# Patient Record
Sex: Female | Born: 1995 | Race: White | Hispanic: No | Marital: Single | Smoking: Never smoker
Health system: Southern US, Community
[De-identification: ages and names within clinical notes are randomized; demographics above are authoritative.]

## PROBLEM LIST (undated history)

## (undated) ENCOUNTER — Inpatient Hospital Stay (HOSPITAL_COMMUNITY): Payer: Self-pay

## (undated) DIAGNOSIS — R1116 Cannabis hyperemesis syndrome: Secondary | ICD-10-CM

## (undated) DIAGNOSIS — R519 Headache, unspecified: Secondary | ICD-10-CM

## (undated) DIAGNOSIS — J45909 Unspecified asthma, uncomplicated: Secondary | ICD-10-CM

## (undated) HISTORY — DX: Cannabis hyperemesis syndrome: R11.16

## (undated) HISTORY — DX: Headache, unspecified: R51.9

## (undated) HISTORY — PX: WISDOM TOOTH EXTRACTION: SHX21

---
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
## 2016-04-05 ENCOUNTER — Encounter (HOSPITAL_COMMUNITY): Payer: Self-pay | Admitting: *Deleted

## 2016-04-05 ENCOUNTER — Emergency Department (HOSPITAL_COMMUNITY)
Admission: EM | Admit: 2016-04-05 | Discharge: 2016-04-06 | Disposition: A | Discharge status: Home / Self Care | Payer: Medicaid Other | Attending: Emergency Medicine | Admitting: Emergency Medicine

## 2016-04-05 ENCOUNTER — Emergency Department (HOSPITAL_COMMUNITY): Payer: Medicaid Other

## 2016-04-05 DIAGNOSIS — J45901 Unspecified asthma with (acute) exacerbation: Secondary | ICD-10-CM | POA: Insufficient documentation

## 2016-04-05 DIAGNOSIS — R059 Cough, unspecified: Secondary | ICD-10-CM

## 2016-04-05 DIAGNOSIS — Z793 Long term (current) use of hormonal contraceptives: Secondary | ICD-10-CM | POA: Diagnosis not present

## 2016-04-05 DIAGNOSIS — Z79899 Other long term (current) drug therapy: Secondary | ICD-10-CM | POA: Diagnosis not present

## 2016-04-05 DIAGNOSIS — F17200 Nicotine dependence, unspecified, uncomplicated: Secondary | ICD-10-CM | POA: Diagnosis not present

## 2016-04-05 DIAGNOSIS — R05 Cough: Secondary | ICD-10-CM

## 2016-04-05 DIAGNOSIS — J029 Acute pharyngitis, unspecified: Secondary | ICD-10-CM | POA: Diagnosis not present

## 2016-04-05 DIAGNOSIS — R062 Wheezing: Secondary | ICD-10-CM

## 2016-04-05 HISTORY — DX: Unspecified asthma, uncomplicated: J45.909

## 2016-04-05 MED ORDER — ALBUTEROL SULFATE (2.5 MG/3ML) 0.083% IN NEBU
5.0000 mg | INHALATION_SOLUTION | Freq: Once | RESPIRATORY_TRACT | Status: AC
  Administered 2016-04-05: 5 mg via RESPIRATORY_TRACT
  Filled 2016-04-05: qty 6

## 2016-04-05 MED ORDER — BENZONATATE 100 MG PO CAPS: 100.0000 mg | ORAL_CAPSULE | Freq: Three times a day (TID) | ORAL | Status: DC

## 2016-04-05 MED ORDER — IPRATROPIUM BROMIDE 0.02 % IN SOLN
0.5000 mg | Freq: Once | RESPIRATORY_TRACT | Status: AC
  Administered 2016-04-05: 0.5 mg via RESPIRATORY_TRACT
  Filled 2016-04-05: qty 2.5
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
## 2016-04-05 NOTE — Discharge Instructions (Signed)
Take the prescribed medication as directed to help with cough.  Use albuterol inhaler for rescue. Follow-up with your primary care physician. Return to the ED for new or worsening symptoms.
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
## 2016-04-05 NOTE — Progress Notes (Signed)
Patient listed as having [place] without a pcp.  Pcp listed on patient's Medicaid card is Dr. Feliberto Hartsavid Martin Rubin.  Patient confirms this is her pcp.  System updated.

## 2016-04-06 NOTE — ED Notes (Signed)
Patient was alert, oriented and stable upon discharge. RN went over AVS and patient had no further questions.  

## 2016-07-03 ENCOUNTER — Inpatient Hospital Stay (HOSPITAL_COMMUNITY): Payer: Medicaid Other

## 2016-07-03 ENCOUNTER — Encounter (HOSPITAL_COMMUNITY): Payer: Self-pay

## 2016-07-03 ENCOUNTER — Inpatient Hospital Stay (HOSPITAL_COMMUNITY)
Admission: AD | Admit: 2016-07-03 | Discharge: 2016-07-04 | Disposition: A | Discharge status: Home / Self Care | Payer: Medicaid Other | Source: Ambulatory Visit | Attending: Pediatrics | Admitting: Pediatrics

## 2016-07-03 DIAGNOSIS — Z3A01 Less than 8 weeks gestation of pregnancy: Secondary | ICD-10-CM | POA: Diagnosis not present

## 2016-07-03 DIAGNOSIS — O99331 Smoking (tobacco) complicating pregnancy, first trimester: Secondary | ICD-10-CM | POA: Diagnosis not present

## 2016-07-03 DIAGNOSIS — B9689 Other specified bacterial agents as the cause of diseases classified elsewhere: Secondary | ICD-10-CM | POA: Diagnosis not present

## 2016-07-03 DIAGNOSIS — O23591 Infection of other part of genital tract in pregnancy, first trimester: Secondary | ICD-10-CM | POA: Insufficient documentation

## 2016-07-03 DIAGNOSIS — Z3491 Encounter for supervision of normal pregnancy, unspecified, first trimester: Secondary | ICD-10-CM

## 2016-07-03 DIAGNOSIS — F17200 Nicotine dependence, unspecified, uncomplicated: Secondary | ICD-10-CM | POA: Diagnosis not present

## 2016-07-03 DIAGNOSIS — N760 Acute vaginitis: Secondary | ICD-10-CM | POA: Insufficient documentation

## 2016-07-03 DIAGNOSIS — R102 Pelvic and perineal pain: Secondary | ICD-10-CM | POA: Diagnosis present

## 2016-07-03 DIAGNOSIS — R109 Unspecified abdominal pain: Secondary | ICD-10-CM

## 2016-07-03 DIAGNOSIS — O26899 Other specified pregnancy related conditions, unspecified trimester: Secondary | ICD-10-CM

## 2016-07-03 DIAGNOSIS — A499 Bacterial infection, unspecified: Secondary | ICD-10-CM | POA: Diagnosis not present

## 2016-07-03 LAB — WET PREP, GENITAL
Sperm: NONE SEEN
Trich, Wet Prep: NONE SEEN
Yeast Wet Prep HPF POC: NONE SEEN

## 2016-07-03 LAB — CBC
HCT: 35.9 % — ABNORMAL LOW (ref 36.0–46.0)
Hemoglobin: 12.1 g/dL (ref 12.0–15.0)
MCH: 30.3 pg (ref 26.0–34.0)
MCHC: 33.7 g/dL (ref 30.0–36.0)
MCV: 89.8 fL (ref 78.0–100.0)
Platelets: 217 10*3/uL (ref 150–400)
RBC: 4 MIL/uL (ref 3.87–5.11)
RDW: 13.5 % (ref 11.5–15.5)
WBC: 9.3 10*3/uL (ref 4.0–10.5)

## 2016-07-03 LAB — URINE MICROSCOPIC-ADD ON

## 2016-07-03 LAB — URINALYSIS, ROUTINE W REFLEX MICROSCOPIC
Bilirubin Urine: NEGATIVE
Glucose, UA: NEGATIVE mg/dL
Hgb urine dipstick: NEGATIVE
Ketones, ur: NEGATIVE mg/dL
Nitrite: POSITIVE — AB
Protein, ur: NEGATIVE mg/dL
Specific Gravity, Urine: 1.01 (ref 1.005–1.030)
pH: 6.5 (ref 5.0–8.0)

## 2016-07-03 LAB — POCT PREGNANCY, URINE: Preg Test, Ur: POSITIVE — AB

## 2016-07-03 LAB — HCG, QUANTITATIVE, PREGNANCY: hCG, Beta Chain, Quant, S: 12211 m[IU]/mL — ABNORMAL HIGH (ref <5)

## 2016-07-03 NOTE — MAU Provider Note (Signed)
History   [id number]   Chief Complaint  Patient presents with  . Possible Pregnancy    HPI Vicki Daniel is a 20 y.o. female  G0P0000 at Unknown pregnancy here with report of lower uterine cramping intermittent x 2 weeks.  Pain is midpelvis rated a 8/10.  Denies vaginal bleeding.  No report of nausea or vomiting.  Pt does not remember LMP.  No report of abnormal vaginal discharge.     No LMP recorded (lmp unknown).  OB History  Gravida Para Term Preterm AB Living  0 0 0 0 0 0  SAB TAB Ectopic Multiple Live Births  0 0 0 0 0        Past Medical History:  Diagnosis Date  . Asthma     History reviewed. No pertinent family history.  Social History   Social History  . Marital status: Single    Spouse name: N/A  . Number of children: N/A  . Years of education: N/A   Social History Main Topics  . Smoking status: Current Some Day Smoker  . Smokeless tobacco: Never Used  . Alcohol use No  . Drug use: No  . Sexual activity: Yes    Birth control/ protection: None   Other Topics Concern  . None   Social History Narrative  . None    No Known Allergies  No current facility-administered medications on file prior to encounter.    Current Outpatient Prescriptions on File Prior to Encounter  Medication Sig Dispense Refill  . benzonatate (TESSALON) 100 MG capsule Take 1 capsule (100 mg total) by mouth every 8 (eight) hours. 21 capsule 0  . cetirizine (ZYRTEC) 10 MG tablet Take 10 mg by mouth daily as needed for allergies.   12  . dextromethorphan-guaiFENesin (MUCINEX DM) 30-600 MG 12hr tablet Take 1 tablet by mouth 2 (two) times daily as needed for cough.    [facility] 1/35 1-35 MG-MCG tablet Take 1 tablet by mouth daily.  12  . Pseudoephedrine-APAP-DM (DAYQUIL PO) Take 30 mLs by mouth every 6 (six) hours as needed (cold symptoms).       Review of Systems   Physical Exam   Vitals:   07/03/16 2151  BP: 116/68  Pulse: 70  Resp: 16  Temp: 98.2 F (36.8 C)   TempSrc: Oral  SpO2: 100%  Weight: 175 lb (79.4 kg)  Height: 5\' 9"  (1.753 m)    Physical Exam  MAU Course  Procedures  Results for orders placed or performed during the hospital encounter of 07/03/16 (from the past 24 hour(s))  Urinalysis, Routine w reflex microscopic (not at [facility])     Status: Abnormal   Collection Time: 07/03/16  9:58 PM  Result Value Ref Range   Color, Urine YELLOW YELLOW   APPearance CLEAR CLEAR   Specific Gravity, Urine 1.010 1.005 - 1.030   pH 6.5 5.0 - 8.0   Glucose, UA NEGATIVE NEGATIVE mg/dL   Hgb urine dipstick NEGATIVE NEGATIVE   Bilirubin Urine NEGATIVE NEGATIVE   Ketones, ur NEGATIVE NEGATIVE mg/dL   Protein, ur NEGATIVE NEGATIVE mg/dL   Nitrite POSITIVE (A) NEGATIVE   Leukocytes, UA TRACE (A) NEGATIVE  Urine microscopic-add on     Status: Abnormal   Collection Time: 07/03/16  9:58 PM  Result Value Ref Range   Squamous Epithelial / LPF 0-5 (A) NONE SEEN   WBC, UA 0-5 0 - 5 WBC/hpf   RBC / HPF 0-5 0 - 5 RBC/hpf   Bacteria, UA MANY (A)  NONE SEEN  Pregnancy, urine POC     Status: Abnormal   Collection Time: 07/03/16 10:34 PM  Result Value Ref Range   Preg Test, Ur POSITIVE (A) NEGATIVE  CBC     Status: Abnormal   Collection Time: 07/03/16 10:45 PM  Result Value Ref Range   WBC 9.3 4.0 - 10.5 K/uL   RBC 4.00 3.87 - 5.11 MIL/uL   Hemoglobin 12.1 12.0 - 15.0 g/dL   HCT 78.2 (L) 95.6 - 21.3 %   MCV 89.8 78.0 - 100.0 fL   MCH 30.3 26.0 - 34.0 pg   MCHC 33.7 30.0 - 36.0 g/dL   RDW 08.6 57.8 - 46.9 %   Platelets 217 150 - 400 K/uL  hCG, quantitative, pregnancy     Status: Abnormal   Collection Time: 07/03/16 10:45 PM  Result Value Ref Range   hCG, Beta Chain, Quant, S 12,211 (H) <5 mIU/mL  Wet prep, genital     Status: Abnormal   Collection Time: 07/03/16 11:27 PM  Result Value Ref Range   Yeast Wet Prep HPF POC NONE SEEN NONE SEEN   Trich, Wet Prep NONE SEEN NONE SEEN   Clue Cells Wet Prep HPF POC PRESENT (A) NONE SEEN   WBC, Wet Prep  HPF POC FEW (A) NONE SEEN   Sperm NONE SEEN     Ultrasound: FINDINGS: The uterus is anteverted. There is a single intrauterine gestational sac containing a yolk sac and fetal pole. The fetus is too small to detect cardiac activity. CRL:  2  mm   5 w   5 d                  [place] EDC: 02/10/2017 Subchorionic hemorrhage:  None visualized. Maternal uterus/adnexae: The maternal ovaries appear unremarkable. There is a 1.4 x 1.0 x 1.2 cm corpus luteum in the left ovary. No no significant free fluid within pelvis. IMPRESSION: Single intrauterine pregnancy with an estimated gestational age of [redacted] weeks, 5 days. No definite fetal cardiac activity identified at this time. Follow-up recommended. Assessment and Plan  20 y.o. G0P0000 at 5.5 wks IUP Bacterial Vaginosis  Plan: Discharge home RX Flagyl 500 mg BID x 7 days Begin prenatal vitamins Schedule prenatal care visit  Vicki Daniel Kennith Gain, CNM 07/03/2016 12:08 AM

## 2016-07-03 NOTE — MAU Note (Signed)
Pt had +upt at home. Does not remember LMP. Is not having cramping now-had some cramping every dayx 3 weeks. Denies vag bleeding or discharge.

## 2016-07-04 ENCOUNTER — Encounter (HOSPITAL_COMMUNITY): Payer: Self-pay | Admitting: *Deleted

## 2016-07-04 DIAGNOSIS — A499 Bacterial infection, unspecified: Secondary | ICD-10-CM | POA: Diagnosis not present

## 2016-07-04 DIAGNOSIS — N760 Acute vaginitis: Secondary | ICD-10-CM

## 2016-07-04 LAB — HIV ANTIBODY (ROUTINE TESTING W REFLEX): HIV Screen 4th Generation wRfx: NONREACTIVE

## 2016-07-04 MED ORDER — PRENATAL VITAMINS 0.8 MG PO TABS: 1.0000 | ORAL_TABLET | Freq: Every day | ORAL | 12 refills | Status: DC

## 2016-07-04 MED ORDER — METRONIDAZOLE 500 MG PO TABS: 500.0000 mg | ORAL_TABLET | Freq: Two times a day (BID) | ORAL | 0 refills | Status: AC

## 2016-07-04 NOTE — Discharge Instructions (Signed)

## 2016-07-05 LAB — GC/CHLAMYDIA PROBE AMP (~~LOC~~) NOT AT ARMC
Chlamydia: NEGATIVE
Neisseria Gonorrhea: NEGATIVE

## 2016-07-20 ENCOUNTER — Inpatient Hospital Stay (HOSPITAL_COMMUNITY)
Admission: AD | Admit: 2016-07-20 | Discharge: 2016-07-20 | Disposition: A | Discharge status: Home / Self Care | Payer: Medicaid Other | Source: Ambulatory Visit | Attending: Obstetrics and Gynecology | Admitting: Obstetrics and Gynecology

## 2016-07-20 DIAGNOSIS — O26891 Other specified pregnancy related conditions, first trimester: Secondary | ICD-10-CM | POA: Diagnosis not present

## 2016-07-20 DIAGNOSIS — N760 Acute vaginitis: Principal | ICD-10-CM

## 2016-07-20 DIAGNOSIS — R12 Heartburn: Secondary | ICD-10-CM | POA: Diagnosis not present

## 2016-07-20 DIAGNOSIS — B9689 Other specified bacterial agents as the cause of diseases classified elsewhere: Secondary | ICD-10-CM

## 2016-07-20 MED ORDER — METRONIDAZOLE 500 MG PO TABS
500.0000 mg | ORAL_TABLET | Freq: Two times a day (BID) | ORAL | 0 refills | Status: DC
Start: 2016-07-20 — End: 2017-01-02

## 2016-07-20 MED ORDER — PRENATAL VITAMINS 0.8 MG PO TABS: 1.0000 | ORAL_TABLET | Freq: Every day | ORAL | 12 refills | Status: DC

## 2016-07-20 MED ORDER — FAMOTIDINE 20 MG PO TABS
20.0000 mg | ORAL_TABLET | Freq: Two times a day (BID) | ORAL | 0 refills | Status: DC
Start: 2016-07-20 — End: 2016-07-23

## 2016-07-20 NOTE — MAU Provider Note (Signed)
Ms. Vicki Daniel is a 20 y.o. G1P0000 at 3131w1d who presents to MAU today requesting her medication be re-sent to her pharmacy. She had Rx for Flagyl and Prenatal vitamins sent to her pharmacy on 07/04/16. She states that it was never received. She is also complaining of heartburn. She has not taken anything for this over-the-counter yet. She denies abdominal pain or vaginal bleeding. She had IUP on US previously and [facility].   Patient reassured medications would be resent and that we would add Rx for Pepcid for heartburn. Patient voiced understanding.   A: SIUP at 5431w1d Bacterial vaginosis Heartburn  P: Discharge home Rx for Prenatal vitamins, Flagyl and Pepcid resent to patients pharmacy First trimester precautions discussed Patient advised [facility] to start prenatal care Patient may return to MAU as needed or if her condition were to change or worsen  Marny LowensteinJulie N Ayumi Wangerin, PA-C 07/20/2016 5:55 PM

## 2016-07-20 NOTE — MAU Note (Signed)
Pt presents to MAU stating that she was evaluated last month and her medications were not called in. Vonzella NippleJulie Wenzel PA in with pt discussing plan of care with pt.

## 2016-07-20 NOTE — Discharge Instructions (Signed)

## 2016-07-23 ENCOUNTER — Inpatient Hospital Stay (HOSPITAL_COMMUNITY)
Admission: AD | Admit: 2016-07-23 | Discharge: 2016-07-23 | Disposition: A | Discharge status: Home / Self Care | Payer: Medicaid Other | Source: Ambulatory Visit | Attending: Obstetrics & Gynecology | Admitting: Obstetrics & Gynecology

## 2016-07-23 ENCOUNTER — Encounter (HOSPITAL_COMMUNITY): Payer: Self-pay | Admitting: *Deleted

## 2016-07-23 ENCOUNTER — Inpatient Hospital Stay (HOSPITAL_COMMUNITY): Payer: Medicaid Other

## 2016-07-23 DIAGNOSIS — O4691 Antepartum hemorrhage, unspecified, first trimester: Secondary | ICD-10-CM

## 2016-07-23 DIAGNOSIS — O99511 Diseases of the respiratory system complicating pregnancy, first trimester: Secondary | ICD-10-CM | POA: Insufficient documentation

## 2016-07-23 DIAGNOSIS — O26891 Other specified pregnancy related conditions, first trimester: Secondary | ICD-10-CM | POA: Diagnosis not present

## 2016-07-23 DIAGNOSIS — R109 Unspecified abdominal pain: Secondary | ICD-10-CM | POA: Insufficient documentation

## 2016-07-23 DIAGNOSIS — O2341 Unspecified infection of urinary tract in pregnancy, first trimester: Secondary | ICD-10-CM | POA: Diagnosis not present

## 2016-07-23 DIAGNOSIS — J45909 Unspecified asthma, uncomplicated: Secondary | ICD-10-CM | POA: Diagnosis not present

## 2016-07-23 DIAGNOSIS — O360110 Maternal care for anti-D [Rh] antibodies, first trimester, not applicable or unspecified: Secondary | ICD-10-CM

## 2016-07-23 DIAGNOSIS — O209 Hemorrhage in early pregnancy, unspecified: Secondary | ICD-10-CM | POA: Insufficient documentation

## 2016-07-23 DIAGNOSIS — Z3A08 8 weeks gestation of pregnancy: Secondary | ICD-10-CM | POA: Insufficient documentation

## 2016-07-23 DIAGNOSIS — Z3491 Encounter for supervision of normal pregnancy, unspecified, first trimester: Secondary | ICD-10-CM

## 2016-07-23 DIAGNOSIS — R110 Nausea: Secondary | ICD-10-CM | POA: Diagnosis not present

## 2016-07-23 DIAGNOSIS — Z87891 Personal history of nicotine dependence: Secondary | ICD-10-CM | POA: Diagnosis not present

## 2016-07-23 DIAGNOSIS — Z79899 Other long term (current) drug therapy: Secondary | ICD-10-CM | POA: Diagnosis not present

## 2016-07-23 DIAGNOSIS — O26899 Other specified pregnancy related conditions, unspecified trimester: Secondary | ICD-10-CM

## 2016-07-23 LAB — URINALYSIS, ROUTINE W REFLEX MICROSCOPIC
Bilirubin Urine: NEGATIVE
Glucose, UA: 100 mg/dL — AB
Hgb urine dipstick: NEGATIVE
Ketones, ur: NEGATIVE mg/dL
Leukocytes, UA: NEGATIVE
Nitrite: POSITIVE — AB
Protein, ur: NEGATIVE mg/dL
Specific Gravity, Urine: >1.03 — ABNORMAL HIGH (ref 1.005–1.030)
pH: 5.5 (ref 5.0–8.0)

## 2016-07-23 LAB — CBC
HCT: 36 % (ref 36.0–46.0)
Hemoglobin: 12.7 g/dL (ref 12.0–15.0)
MCH: 31.1 pg (ref 26.0–34.0)
MCHC: 35.3 g/dL (ref 30.0–36.0)
MCV: 88 fL (ref 78.0–100.0)
Platelets: 228 10*3/uL (ref 150–400)
RBC: 4.09 MIL/uL (ref 3.87–5.11)
RDW: 13.2 % (ref 11.5–15.5)
WBC: 10 10*3/uL (ref 4.0–10.5)

## 2016-07-23 LAB — URINE MICROSCOPIC-ADD ON

## 2016-07-23 LAB — HCG, QUANTITATIVE, PREGNANCY: hCG, Beta Chain, Quant, S: 82069 m[IU]/mL — ABNORMAL HIGH (ref <5)

## 2016-07-23 LAB — ABO/RH: ABO/RH(D): A NEG

## 2016-07-23 MED ORDER — RHO D IMMUNE GLOBULIN 1500 UNIT/2ML IJ SOSY
300.0000 ug | PREFILLED_SYRINGE | Freq: Once | INTRAMUSCULAR | Status: AC
  Administered 2016-07-23: 300 ug via INTRAMUSCULAR
  Filled 2016-07-23: qty 2

## 2016-07-23 MED ORDER — PROMETHAZINE HCL 12.5 MG PO TABS: 12.5000 mg | ORAL_TABLET | Freq: Four times a day (QID) | ORAL | 0 refills | Status: DC | PRN

## 2016-07-23 MED ORDER — CEPHALEXIN 500 MG PO CAPS: 500.0000 mg | ORAL_CAPSULE | Freq: Four times a day (QID) | ORAL | 0 refills | Status: DC

## 2016-07-23 MED ORDER — PROMETHAZINE HCL 25 MG PO TABS
25.0000 mg | ORAL_TABLET | Freq: Once | ORAL | Status: AC
  Administered 2016-07-23: 25 mg via ORAL
  Filled 2016-07-23: qty 1

## 2016-07-23 NOTE — MAU Provider Note (Signed)
History     CSN: [id number]  Arrival date and time: 07/23/16 1409   First Provider Initiated Contact with Patient 07/23/16 1501      Chief Complaint  Patient presents with  . Vaginal Bleeding  . Abdominal Cramping   HPI Vicki Daniel is a 20 y.o. G1P0000 at [redacted]w[redacted]d who presents with abdominal pain & vaginal bleeding. Reports lower abdominal cramping that has been present throughout pregnancy. Since yesterday, has noticed intermittent sharp pain in suprapubic area that is new. Currently rates pain 0/10.  Reports vaginal bleeding that started yesterday. Has noticed blood on toilet paper. Spotting was pink and is now brown. Denies recent intercourse.   Denies dysuria, diarrhea, constipation, urinary frequency, or vaginal discharge. Some nausea, no vomiting.  Plans on going to [facility] for prenatal care but hasn't scheduled appt yet.  OB History    Gravida Para Term Preterm AB Living   1 0 0 0 0 0   SAB TAB Ectopic Multiple Live Births   0 0 0 0 0      Past Medical History:  Diagnosis Date  . Asthma     Past Surgical History:  Procedure Laterality Date  . WISDOM TOOTH EXTRACTION      No family history on file.  Social History  Substance Use Topics  . Smoking status: Former Smoker    Quit date: 04/22/2016  . Smokeless tobacco: Never Used  . Alcohol use No    Allergies: No Known Allergies  Prescriptions Prior to Admission  Medication Sig Dispense Refill Last Dose  . cetirizine (ZYRTEC) 10 MG tablet Take 10 mg by mouth daily as needed for allergies.   12 04/04/2016 at Unknown time  . famotidine (PEPCID) 20 MG tablet Take 1 tablet (20 mg total) by mouth 2 (two) times daily. 30 tablet 0   . metroNIDAZOLE (FLAGYL) 500 MG tablet Take 1 tablet (500 mg total) by mouth 2 (two) times daily. 14 tablet 0   . Prenatal Multivit-Min-Fe-FA (PRENATAL VITAMINS) 0.8 MG tablet Take 1 tablet by mouth daily. 30 tablet 12     Review of Systems  Constitutional: Negative.    Gastrointestinal: Positive for abdominal pain and nausea. Negative for constipation, diarrhea and vomiting.  Genitourinary: Negative for dysuria, flank pain, frequency and hematuria.       + vaginal bleeding   Physical Exam   Blood pressure 120/75, pulse 77, temperature 97.7 F (36.5 C), temperature source Oral, resp. rate 18, height  (1.753 m), weight 172 lb (78 kg), SpO2 99 %.  Physical Exam  Nursing note and vitals reviewed. Constitutional: She is oriented to person, place, and time. She appears well-developed and well-nourished. No distress.  HENT:  Head: Normocephalic and atraumatic.  Eyes: Conjunctivae are normal. Right eye exhibits no discharge. Left eye exhibits no discharge. No scleral icterus.  Neck: Normal range of motion.  Cardiovascular: Normal rate, regular rhythm and normal heart sounds.   No murmur heard. Respiratory: Effort normal and breath sounds normal. No respiratory distress. She has no wheezes.  GI: Soft. Bowel sounds are normal. She exhibits no distension. There is no tenderness. There is no rebound.  Genitourinary: Uterus normal. Cervix exhibits no motion tenderness and no friability. Vaginal discharge (minimal amount of tan discharge/old blood) found.  Genitourinary Comments: Cervix closed  Neurological: She is alert and oriented to person, place, and time.  Skin: Skin is warm and dry. She is not diaphoretic.  Psychiatric: She has a normal mood and affect. Her behavior  is normal. Judgment and thought content normal.    MAU Course  Procedures Results for orders placed or performed during the hospital encounter of 07/23/16 (from the past 24 hour(s))  Urinalysis, Routine w reflex microscopic (not at [facility])     Status: Abnormal   Collection Time: 07/23/16  2:27 PM  Result Value Ref Range   Color, Urine YELLOW YELLOW   APPearance CLEAR CLEAR   Specific Gravity, Urine >1.030 (H) 1.005 - 1.030   pH 5.5 5.0 - 8.0   Glucose, UA 100 (A) NEGATIVE mg/dL   Hgb  urine dipstick NEGATIVE NEGATIVE   Bilirubin Urine NEGATIVE NEGATIVE   Ketones, ur NEGATIVE NEGATIVE mg/dL   Protein, ur NEGATIVE NEGATIVE mg/dL   Nitrite POSITIVE (A) NEGATIVE   Leukocytes, UA NEGATIVE NEGATIVE  Urine microscopic-add on     Status: Abnormal   Collection Time: 07/23/16  2:27 PM  Result Value Ref Range   Squamous Epithelial / LPF 0-5 (A) NONE SEEN   WBC, UA 6-30 0 - 5 WBC/hpf   RBC / HPF 0-5 0 - 5 RBC/hpf   Bacteria, UA MANY (A) NONE SEEN  CBC     Status: None   Collection Time: 07/23/16  2:43 PM  Result Value Ref Range   WBC 10.0 4.0 - 10.5 K/uL   RBC 4.09 3.87 - 5.11 MIL/uL   Hemoglobin 12.7 12.0 - 15.0 g/dL   HCT 81.1 91.4 - 78.2 %   MCV 88.0 78.0 - 100.0 fL   MCH 31.1 26.0 - 34.0 pg   MCHC 35.3 30.0 - 36.0 g/dL   RDW 95.6 21.3 - 08.6 %   Platelets 228 150 - 400 K/uL  ABO/Rh     Status: None   Collection Time: 07/23/16  2:43 PM  Result Value Ref Range   ABO/RH(D) A NEG   hCG, quantitative, pregnancy     Status: Abnormal   Collection Time: 07/23/16  2:43 PM  Result Value Ref Range   hCG, Beta Chain, Quant, S 82,069 (H) <5 mIU/mL  Rh IG workup (includes ABO/Rh)     Status: None (Preliminary result)   Collection Time: 07/23/16  2:43 PM  Result Value Ref Range   Gestational Age(Wks) 8    ABO/RH(D) A NEG    Antibody Screen NEG    Unit Number [id number]    Blood Component Type RHIG    Unit division 00    Status of Unit ISSUED    Transfusion Status OK TO TRANSFUSE    US Ob Transvaginal  Result Date: 07/23/2016 CLINICAL DATA:  Vaginal bleeding in first trimester pregnancy. Inconclusive fetal viability. Gestational age by LMP of 8 weeks 4 days. EXAM: TRANSVAGINAL OB ULTRASOUND TECHNIQUE: Transvaginal ultrasound was performed for complete evaluation of the gestation as well as the maternal uterus, adnexal regions, and pelvic cul-de-sac. COMPARISON:  07/03/2016 FINDINGS: Intrauterine gestational sac: Single Yolk sac:  Visualized Embryo:  Visualized Cardiac  Activity: Visualized Heart Rate: 154 bpm CRL:   21  mm   8 w 5 d                  [place] EDC: 02/27/2017 Subchorionic hemorrhage:  None visualized. Maternal uterus/adnexae: Both aphasia normal appearance. No mass or free fluid identified IMPRESSION: Single living IUP measuring 8 weeks 5 days. This shows appropriate growth since prior study, and is concordant with LMP. No significant maternal uterine or adnexal abnormality identified. Electronically Signed   By: Myles Rosenthal M.D.   On: 07/23/2016 16:18  MDM GC/CT & wet prep collected last visit; positive for BV, currently being tx A negative -- rhogham today Ultrasound shows SIUP with cardiac activity Cervix closed Phenergan 25 mg PO in MAU  Assessment and Plan  A: 1. Normal IUP (intrauterine pregnancy) on prenatal ultrasound, first trimester   2. Vaginal bleeding in pregnancy, first trimester   3. Pregnancy related nausea, antepartum   4. Rh negative state in antepartum period, first trimester, not applicable or unspecified fetus   5. UTI in pregnancy, first trimester    P: Discharge home Rx keflex, phenergan Continue meds previously prescribed Pelvic rest Urine culture pending Discussed reasons to return to MAU Schedule prenatal care  Judeth Hornrin Sumeya Yontz 07/23/2016, 3:01 PM

## 2016-07-23 NOTE — Discharge Instructions (Signed)
Pregnancy and Urinary Tract Infection °A urinary tract infection (UTI) is a bacterial infection of the urinary tract. Infection of the urinary tract can include the ureters, kidneys (pyelonephritis), bladder (cystitis), and urethra (urethritis). All pregnant women should be screened for bacteria in the urinary tract. Identifying and treating a UTI will decrease the risk of preterm labor and developing more serious infections in both the mother and baby. °CAUSES °Bacteria germs cause almost all UTIs.  °RISK FACTORS °Many factors can increase your chances of getting a UTI during pregnancy. These include: °· Having a short urethra. °· Poor toilet and hygiene habits. °· Sexual intercourse. °· Blockage of urine along the urinary tract. °· Problems with the pelvic muscles or nerves. °· Diabetes. °· Obesity. °· Bladder problems after having several children. °· Previous history of UTI. °SIGNS AND SYMPTOMS  °· Pain, burning, or a stinging feeling when urinating. °· Suddenly feeling the need to urinate right away (urgency). °· Loss of bladder control (urinary incontinence). °· Frequent urination, more than is common with pregnancy. °· Lower abdominal or back discomfort. °· Cloudy urine. °· Blood in the urine (hematuria). °· Fever.  °When the kidneys are infected, the symptoms may be: °· Back pain. °· Flank pain on the right side more so than the left. °· Fever. °· Chills. °· Nausea. °· Vomiting. °DIAGNOSIS  °A urinary tract infection is usually diagnosed through urine tests. Additional tests and procedures are sometimes done. These may include: °· Ultrasound exam of the kidneys, ureters, bladder, and urethra. °· Looking in the bladder with a lighted tube (cystoscopy). °TREATMENT °Typically, UTIs can be treated with antibiotic medicines.  °HOME CARE INSTRUCTIONS  °· Only take over-the-counter or prescription medicines as directed by your health care provider. If you were prescribed antibiotics, take them as directed. Finish  them even if you start to feel better. °· Drink enough fluids to keep your urine clear or pale yellow. °· Do not have sexual intercourse until the infection is gone and your health care provider says it is okay. °· Make sure you are tested for UTIs throughout your pregnancy. These infections often come back.  °Preventing a UTI in the Future °· Practice good toilet habits. Always wipe from front to back. Use the tissue only once. °· Do not hold your urine. Empty your bladder as soon as possible when the urge comes. °· Do not douche or use deodorant sprays. °· Wash with soap and warm water around the genital area and the anus. °· Empty your bladder before and after sexual intercourse. °· Wear underwear with a cotton crotch. °· Avoid caffeine and carbonated drinks. They can irritate the bladder. °· Drink cranberry juice or take cranberry pills. This may decrease the risk of getting a UTI. °· Do not drink alcohol. °· Keep all your appointments and tests as scheduled.  °SEEK MEDICAL CARE IF:  °· Your symptoms get worse. °· You are still having fevers 2 or more days after treatment begins. °· You have a rash. °· You feel that you are having problems with medicines prescribed. °· You have abnormal vaginal discharge. °SEEK IMMEDIATE MEDICAL CARE IF:  °· You have back or flank pain. °· You have chills. °· You have blood in your urine. °· You have nausea and vomiting. °· You have contractions of your uterus. °· You have a gush of fluid from the vagina. °MAKE SURE YOU: °· Understand these instructions.   °· Will watch your condition.   °· Will get help right away if you are not doing   well or get worse.    This information is not intended to replace advice given to you by your health care provider. Make sure you discuss any questions you have with your health care provider.   Document Released: 03/25/2011 Document Revised: 09/18/2013 Document Reviewed: 06/27/2013 Elsevier Interactive Patient Education 2016 [employer]  Inc[employer]    Vaginal Bleeding During Pregnancy, First Trimester A small amount of bleeding (spotting) from the vagina is common in early pregnancy. Sometimes the bleeding is normal and is not a problem, and sometimes it is a sign of something serious. Be sure to tell your doctor about any bleeding from your vagina right away. HOME CARE  Watch your condition for any changes.  Follow your doctor's instructions about how active you can be.  If you are on bed rest:  You may need to stay in bed and only get up to use the bathroom.  You may be allowed to do some activities.  If you need help, make plans for someone to help you.  Write down:  The number of pads you use each day.  How often you change pads.  How soaked (saturated) your pads are.  Do not use tampons.  Do not douche.  Do not have sex or orgasms until your doctor says it is okay.  If you pass any tissue from your vagina, save the tissue so you can show it to your doctor.  Only take medicines as told by your doctor.  Do not take aspirin because it can make you bleed.  Keep all follow-up visits as told by your doctor. GET HELP IF:   You bleed from your vagina.  You have cramps.  You have labor pains.  You have a fever that does not go away after you take medicine. GET HELP RIGHT AWAY IF:   You have very bad cramps in your back or belly (abdomen).  You pass large clots or tissue from your vagina.  You bleed more.  You feel light-headed or weak.  You pass out (faint).  You have chills.  You are leaking fluid or have a gush of fluid from your vagina.  You pass out while pooping (having a bowel movement). MAKE SURE YOU:  Understand these instructions.  Will watch your condition.  Will get help right away if you are not doing well or get worse.   This information is not intended to replace advice given to you by your health care provider. Make sure you discuss any questions you have with your  health care provider.   Document Released: 04/14/2014 Document Reviewed: 04/14/2014 Elsevier Interactive Patient Education 2016 [employer]   Pelvic Rest Pelvic rest is sometimes recommended for women when:   The placenta is partially or completely covering the opening of the cervix (placenta previa).  There is bleeding between the uterine wall and the amniotic sac in the first trimester (subchorionic hemorrhage).  The cervix begins to open without labor starting (incompetent cervix, cervical insufficiency).  The labor is too early (preterm labor). HOME CARE INSTRUCTIONS  Do not have sexual intercourse, stimulation, or an orgasm.  Do not use tampons, douche, or put anything in the vagina.  Do not lift anything over 10 pounds (4.5 kg).  Avoid strenuous activity or straining your pelvic muscles. SEEK MEDICAL CARE IF:  You have any vaginal bleeding during pregnancy. Treat this as a potential emergency.  You have cramping pain felt low in the stomach (stronger than menstrual cramps).  You notice vaginal discharge (watery,  mucus, or bloody).  You have a low, dull backache.  There are regular contractions or uterine tightening. SEEK IMMEDIATE MEDICAL CARE IF: You have vaginal bleeding and have placenta previa.    This information is not intended to replace advice given to you by your health care provider. Make sure you discuss any questions you have with your health care provider.   Document Released: 03/25/2011 Document Revised: 02/20/2012 Document Reviewed: 06/01/2015 Elsevier Interactive Patient Education [employer]

## 2016-07-23 NOTE — MAU Note (Signed)
Patient states she is [redacted] weeks pregnant and started having some spotting yesterday and today along with lower abdominal cramping that has been intermittent throughout pregnancy.  Last sex was one week ago.  Denies pain at this time, but states it gets up to 7/10.

## 2016-07-24 LAB — CULTURE, OB URINE: Culture: NO GROWTH

## 2016-07-24 LAB — RH IG WORKUP (INCLUDES ABO/RH)
ABO/RH(D): A NEG
Antibody Screen: NEGATIVE
Gestational Age(Wks): 8
Unit division: 0

## 2016-08-01 ENCOUNTER — Other Ambulatory Visit: Payer: Self-pay | Admitting: Medical

## 2016-09-19 DIAGNOSIS — J309 Allergic rhinitis, unspecified: Secondary | ICD-10-CM | POA: Insufficient documentation

## 2016-09-19 DIAGNOSIS — J358 Other chronic diseases of tonsils and adenoids: Secondary | ICD-10-CM | POA: Insufficient documentation

## 2016-09-22 ENCOUNTER — Emergency Department (HOSPITAL_COMMUNITY)
Admission: EM | Admit: 2016-09-22 | Discharge: 2016-09-23 | Disposition: A | Discharge status: Home / Self Care | Payer: Medicaid Other | Attending: Emergency Medicine | Admitting: Emergency Medicine

## 2016-09-22 ENCOUNTER — Emergency Department (HOSPITAL_COMMUNITY): Payer: Medicaid Other

## 2016-09-22 ENCOUNTER — Encounter (HOSPITAL_COMMUNITY): Payer: Self-pay | Admitting: Radiology

## 2016-09-22 DIAGNOSIS — Y92410 Unspecified street and highway as the place of occurrence of the external cause: Secondary | ICD-10-CM | POA: Diagnosis not present

## 2016-09-22 DIAGNOSIS — Z23 Encounter for immunization: Secondary | ICD-10-CM | POA: Insufficient documentation

## 2016-09-22 DIAGNOSIS — S0990XA Unspecified injury of head, initial encounter: Secondary | ICD-10-CM | POA: Insufficient documentation

## 2016-09-22 DIAGNOSIS — Y999 Unspecified external cause status: Secondary | ICD-10-CM | POA: Insufficient documentation

## 2016-09-22 DIAGNOSIS — M545 Low back pain: Secondary | ICD-10-CM | POA: Diagnosis not present

## 2016-09-22 DIAGNOSIS — J45909 Unspecified asthma, uncomplicated: Secondary | ICD-10-CM | POA: Insufficient documentation

## 2016-09-22 DIAGNOSIS — M542 Cervicalgia: Secondary | ICD-10-CM | POA: Diagnosis not present

## 2016-09-22 DIAGNOSIS — S0081XA Abrasion of other part of head, initial encounter: Secondary | ICD-10-CM | POA: Diagnosis not present

## 2016-09-22 DIAGNOSIS — Z87891 Personal history of nicotine dependence: Secondary | ICD-10-CM | POA: Diagnosis not present

## 2016-09-22 DIAGNOSIS — S8991XA Unspecified injury of right lower leg, initial encounter: Secondary | ICD-10-CM | POA: Diagnosis present

## 2016-09-22 DIAGNOSIS — Y939 Activity, unspecified: Secondary | ICD-10-CM | POA: Diagnosis not present

## 2016-09-22 DIAGNOSIS — S8001XA Contusion of right knee, initial encounter: Secondary | ICD-10-CM | POA: Diagnosis not present

## 2016-09-22 MED ORDER — TETANUS-DIPHTH-ACELL PERTUSSIS 5-2.5-18.5 LF-MCG/0.5 IM SUSP
0.5000 mL | Freq: Once | INTRAMUSCULAR | Status: AC
  Administered 2016-09-23: 0.5 mL via INTRAMUSCULAR
  Filled 2016-09-22: qty 0.5

## 2016-09-22 MED ORDER — HYDROCODONE-ACETAMINOPHEN 5-325 MG PO TABS
2.0000 | ORAL_TABLET | Freq: Once | ORAL | Status: AC
  Administered 2016-09-22: 2 via ORAL
  Filled 2016-09-22: qty 2
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
## 2016-09-23 LAB — POC URINE PREG, ED: Preg Test, Ur: NEGATIVE

## 2016-09-23 MED ORDER — HYDROCODONE-ACETAMINOPHEN 5-325 MG PO TABS: 1.0000 | ORAL_TABLET | Freq: Four times a day (QID) | ORAL | 0 refills | Status: DC | PRN

## 2016-09-23 NOTE — ED Notes (Signed)
Patient verbalized understanding of discharge instructions and denies any further needs or questions at this time. VS stable. Patient ambulatory with steady gait, declined wheelchair.  

## 2016-10-31 ENCOUNTER — Encounter: Payer: Self-pay | Admitting: Obstetrics & Gynecology

## 2016-10-31 ENCOUNTER — Encounter (HOSPITAL_COMMUNITY): Payer: Self-pay | Admitting: *Deleted

## 2016-10-31 ENCOUNTER — Ambulatory Visit (INDEPENDENT_AMBULATORY_CARE_PROVIDER_SITE_OTHER): Payer: Medicaid Other | Admitting: General Practice

## 2016-10-31 DIAGNOSIS — Z3202 Encounter for pregnancy test, result negative: Secondary | ICD-10-CM

## 2016-10-31 LAB — POCT PREGNANCY, URINE: Preg Test, Ur: NEGATIVE

## 2016-10-31 NOTE — Progress Notes (Signed)
Patient here for pregnancy test due to missed period. UPT negative. Patient informed.

## 2016-11-07 ENCOUNTER — Ambulatory Visit (HOSPITAL_COMMUNITY)
Admission: EM | Admit: 2016-11-07 | Discharge: 2016-11-07 | Disposition: A | Discharge status: Home / Self Care | Payer: Medicaid Other | Attending: Emergency Medicine | Admitting: Emergency Medicine

## 2016-11-07 ENCOUNTER — Encounter (HOSPITAL_COMMUNITY): Payer: Self-pay | Admitting: Emergency Medicine

## 2016-11-07 DIAGNOSIS — R0982 Postnasal drip: Secondary | ICD-10-CM | POA: Diagnosis not present

## 2016-11-07 DIAGNOSIS — M546 Pain in thoracic spine: Secondary | ICD-10-CM | POA: Diagnosis not present

## 2016-11-07 DIAGNOSIS — M545 Low back pain, unspecified: Secondary | ICD-10-CM

## 2016-11-07 DIAGNOSIS — J029 Acute pharyngitis, unspecified: Secondary | ICD-10-CM | POA: Insufficient documentation

## 2016-11-07 DIAGNOSIS — M436 Torticollis: Secondary | ICD-10-CM | POA: Diagnosis not present

## 2016-11-07 DIAGNOSIS — Z87891 Personal history of nicotine dependence: Secondary | ICD-10-CM | POA: Insufficient documentation

## 2016-11-07 DIAGNOSIS — Z79899 Other long term (current) drug therapy: Secondary | ICD-10-CM | POA: Insufficient documentation

## 2016-11-07 LAB — POCT RAPID STREP A: Streptococcus, Group A Screen (Direct): NEGATIVE

## 2016-11-07 MED ORDER — MELOXICAM 15 MG PO TABS: 15.0000 mg | ORAL_TABLET | Freq: Every day | ORAL | 0 refills | Status: DC

## 2016-11-07 MED ORDER — AZELASTINE HCL 0.1 % NA SOLN: 1.0000 | Freq: Two times a day (BID) | NASAL | 0 refills | Status: DC
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
## 2016-11-07 NOTE — ED Triage Notes (Signed)
PT reports recurrent strep throat. PT last had strep 1-2 months ago. PT reports symptoms have been present for four days.

## 2016-11-07 NOTE — Discharge Instructions (Signed)
Your strep test was negative today. I do not see any evidence of tonsil stones today. Sometimes, having drainage from your nose go to you back of your mouth (post-nasal drip) can cause symptoms. I have prescribed a nasal spray to help dry up the nasal drainage.   For your back pain, I have prescribed you Mobic that you can take once daily for the next week, then daily as needed after that. Do NOT take Ibuprofen, Advil, aleve, or Naproxen while taking this medication. Continue using heat and doing gentle back stretches as well. If your symptoms do not improve or worsen, follow up with Dr. Donnie Coffinubin.

## 2016-11-08 LAB — CULTURE, GROUP A STREP (THRC)

## 2017-01-02 ENCOUNTER — Inpatient Hospital Stay (HOSPITAL_COMMUNITY)
Admission: AD | Admit: 2017-01-02 | Discharge: 2017-01-02 | Disposition: A | Discharge status: Home / Self Care | Payer: Medicaid Other | Source: Ambulatory Visit | Attending: Obstetrics and Gynecology | Admitting: Obstetrics and Gynecology

## 2017-01-02 ENCOUNTER — Encounter (HOSPITAL_COMMUNITY): Payer: Self-pay | Admitting: *Deleted

## 2017-01-02 DIAGNOSIS — A084 Viral intestinal infection, unspecified: Secondary | ICD-10-CM

## 2017-01-02 DIAGNOSIS — Z87891 Personal history of nicotine dependence: Secondary | ICD-10-CM | POA: Insufficient documentation

## 2017-01-02 DIAGNOSIS — R109 Unspecified abdominal pain: Secondary | ICD-10-CM | POA: Diagnosis present

## 2017-01-02 DIAGNOSIS — Z79899 Other long term (current) drug therapy: Secondary | ICD-10-CM | POA: Diagnosis not present

## 2017-01-02 LAB — URINALYSIS, ROUTINE W REFLEX MICROSCOPIC
Bilirubin Urine: NEGATIVE
Glucose, UA: NEGATIVE mg/dL
Hgb urine dipstick: NEGATIVE
Ketones, ur: NEGATIVE mg/dL
Nitrite: NEGATIVE
Protein, ur: NEGATIVE mg/dL
Specific Gravity, Urine: 1.02 (ref 1.005–1.030)
pH: 6 (ref 5.0–8.0)

## 2017-01-02 LAB — URINALYSIS, MICROSCOPIC (REFLEX)

## 2017-01-02 LAB — POCT PREGNANCY, URINE: Preg Test, Ur: NEGATIVE

## 2017-01-02 MED ORDER — ONDANSETRON HCL 4 MG PO TABS: 4.0000 mg | ORAL_TABLET | Freq: Three times a day (TID) | ORAL | 0 refills | Status: DC | PRN

## 2017-01-02 MED ORDER — ONDANSETRON HCL 4 MG PO TABS
4.0000 mg | ORAL_TABLET | Freq: Once | ORAL | Status: AC
  Administered 2017-01-02: 4 mg via ORAL
  Filled 2017-01-02: qty 1

## 2017-01-02 MED ORDER — ONDANSETRON HCL 4 MG PO TABS: 4.0000 mg | ORAL_TABLET | Freq: Four times a day (QID) | ORAL | 0 refills | Status: DC

## 2017-01-02 NOTE — Discharge Instructions (Signed)

## 2017-01-02 NOTE — MAU Provider Note (Signed)
History     CSN: [id number]  Arrival date and time: 01/02/17 2018   First Provider Initiated Contact with Patient 01/02/17 2124      Chief Complaint  Patient presents with  . Abdominal Pain   Vicki Daniel is a 21 y.o. G1P0010 presenting with nausea, vomiting and diarrhea. Retaining little food or fluids today.  She is concerned that she could be pregnant. LMP was sometime in December. She missed 2 oral contraceptive pills since.  Denies lower abdominal pain, irritative vaginal discharge or other GYN concerns.  Requests work excuse.     Emesis   This is a new problem. The current episode started yesterday. The problem occurs 5 to 10 times per day. The problem has been unchanged. The emesis has an appearance of stomach contents. There has been no fever. Associated symptoms include abdominal pain and diarrhea. Pertinent negatives include no chills, fever or URI. Associated symptoms comments: "hunger pains" and "crampy" localized to epigastrum; loose stools x3 today. Risk factors include ill contacts (Parner had vomiting and diarrhea a few days ago). She has tried nothing for the symptoms.    OB History  Gravida Para Term Preterm AB Living  1 0 0 0 1 0  SAB TAB Ectopic Multiple Live Births  1 0 0 0 0    # Outcome Date GA Lbr Len/2nd Weight Sex Delivery Anes PTL Lv  1 SAB                Past Medical History:  Diagnosis Date  . Asthma     Past Surgical History:  Procedure Laterality Date  . WISDOM TOOTH EXTRACTION      History reviewed. No pertinent family history.  Social History  Substance Use Topics  . Smoking status: Former Smoker    Quit date: 04/22/2016  . Smokeless tobacco: Never Used  . Alcohol use Yes    Allergies: No Known Allergies  Prescriptions Prior to Admission  Medication Sig Dispense Refill Last Dose  . azelastine (ASTELIN) 0.1 % nasal spray Place 1 spray into both nostrils 2 (two) times daily. Use in each nostril as directed 30 mL 0   .  cephALEXin (KEFLEX) 500 MG capsule Take 1 capsule (500 mg total) by mouth 4 (four) times daily. 28 capsule 0   . famotidine (PEPCID) 20 MG tablet Take 20 mg by mouth 2 (two) times daily as needed for heartburn or indigestion.   PRN at PRN  . famotidine (PEPCID) 20 MG tablet TAKE 1 TABLET (20 MG TOTAL) BY MOUTH 2 (TWO) TIMES DAILY. 30 tablet 0   . HYDROcodone-acetaminophen (NORCO/VICODIN) 5-325 MG tablet Take 1-2 tablets by mouth every 6 (six) hours as needed. 10 tablet 0   . meloxicam (MOBIC) 15 MG tablet Take 1 tablet (15 mg total) by mouth daily. 20 tablet 0   . metroNIDAZOLE (FLAGYL) 500 MG tablet Take 1 tablet (500 mg total) by mouth 2 (two) times daily. 14 tablet 0 07/22/2016 at Unknown time  . promethazine (PHENERGAN) 12.5 MG tablet Take 1 tablet (12.5 mg total) by mouth every 6 (six) hours as needed for nausea or vomiting. 30 tablet 0     Review of Systems  Constitutional: Negative for chills and fever.  Gastrointestinal: Positive for abdominal pain, diarrhea and vomiting.  Genitourinary: Negative for dysuria.   Physical Exam   Blood pressure 110/66, pulse 94, temperature 98.3 F (36.8 C), temperature source Oral, height 5\' 9"  (1.753 m), weight 84.7 kg (186 lb 12 oz), unknown if  currently breastfeeding.  Physical Exam  Nursing note and vitals reviewed. Constitutional: She is oriented to person, place, and time. She appears well-developed and well-nourished. No distress.  HENT:  Head: Normocephalic.  Eyes: Pupils are equal, round, and reactive to light.  Neck: Normal range of motion.  Cardiovascular: Normal rate.   Respiratory: Effort normal.  GI: Soft. There is no tenderness. There is no guarding.  Musculoskeletal: Normal range of motion.  Neurological: She is alert and oriented to person, place, and time.  Skin: Skin is warm and dry.    MAU Course  Procedures Results for orders placed or performed during the hospital encounter of 01/02/17 (from the past 24 hour(s))   Urinalysis, Routine w reflex microscopic     Status: Abnormal   Collection Time: 01/02/17  8:49 PM  Result Value Ref Range   Color, Urine YELLOW YELLOW   APPearance CLEAR CLEAR   Specific Gravity, Urine 1.020 1.005 - 1.030   pH 6.0 5.0 - 8.0   Glucose, UA NEGATIVE NEGATIVE mg/dL   Hgb urine dipstick NEGATIVE NEGATIVE   Bilirubin Urine NEGATIVE NEGATIVE   Ketones, ur NEGATIVE NEGATIVE mg/dL   Protein, ur NEGATIVE NEGATIVE mg/dL   Nitrite NEGATIVE NEGATIVE   Leukocytes, UA TRACE (A) NEGATIVE  Urinalysis, Microscopic (reflex)     Status: Abnormal   Collection Time: 01/02/17  8:49 PM  Result Value Ref Range   RBC / HPF 0-5 0 - 5 RBC/hpf   WBC, UA 0-5 0 - 5 WBC/hpf   Bacteria, UA FEW (A) NONE SEEN   Squamous Epithelial / LPF 0-5 (A) NONE SEEN  Pregnancy, urine POC     Status: None   Collection Time: 01/02/17  9:04 PM  Result Value Ref Range   Preg Test, Ur NEGATIVE NEGATIVE   Zofran 4mg  po given with improvement     Assessment and Plan   1. Viral gastroenteritis    Advised to do HPT if amenorrheic in 1 wk, continue OCPs and use back up contraception this cycle Allergies as of 01/02/2017   No Known Allergies     Medication List    STOP taking these medications   acetaminophen 325 MG tablet Commonly known as:  TYLENOL   cephALEXin 500 MG capsule Commonly known as:  KEFLEX   ibuprofen 200 MG tablet Commonly known as:  ADVIL,MOTRIN   promethazine 12.5 MG tablet Commonly known as:  PHENERGAN     TAKE these medications   albuterol 108 (90 Base) MCG/ACT inhaler Commonly known as:  PROVENTIL HFA;VENTOLIN HFA Inhale 2 puffs into the lungs every 6 (six) hours as needed for wheezing or shortness of breath.   ethynodiol-ethinyl estradiol 1-35 MG-MCG tablet Commonly known as:  KELNOR,ZOVIA Take 1 tablet by mouth daily.   ondansetron 4 MG tablet Commonly known as:  ZOFRAN Take 1 tablet (4 mg total) by mouth every 8 (eight) hours as needed for nausea or vomiting.       Follow-up Information    Almon HerculesOSS,KENDRA H., MD. Schedule an appointment as soon as possible for a visit.   Specialty:  Obstetrics and Gynecology Why:  As needed Contact information: [address] [place] [address] [phone]          Caren Griffinseirdre Celinda Dethlefs CNM 01/02/2017, 9:33 PM

## 2017-01-02 NOTE — MAU Note (Addendum)
PT SAYS NO CYCLE    IN JAN,     HAD  3  CYCLES  IN DEC.    BIRTH CONTROL-  BCP.    LAST SEX-  YESTERDAY.     NO HPT       BUT MISSED 2 PILLS  IN DEC  GYN  DR KAPLAN-   SEEN LAST  JAN- FEB       STARTED FEELING  ABD  PAIN - YESTERDAY  -   SHARP AND  CRAMPING.    Marland Kitchen.    HAD   VOMITING  LAST NIGHT  AND   TODAY  .      DIARRHEA-   WATERY STOOLS  TODAY .         HAD  FEVER OVER WEEKEND.

## 2017-01-25 ENCOUNTER — Other Ambulatory Visit: Payer: Self-pay | Admitting: Obstetrics & Gynecology

## 2017-04-21 IMAGING — US US OB TRANSVAGINAL
1 series · 15 of 28 positions shown · non-contrast
Comparison: 07/03/2016

CLINICAL DATA: Vaginal bleeding in first trimester pregnancy.
Inconclusive fetal viability. Gestational age by LMP of 8 weeks 4
days.

EXAM:
TRANSVAGINAL OB ULTRASOUND
TECHNIQUE: Transvaginal ultrasound was performed for complete evaluation of the
gestation as well as the maternal uterus, adnexal regions, and
pelvic cul-de-sac.

[Series 1: us ob transvaginal · 15 of 37 slices shown]
[im 1/37]
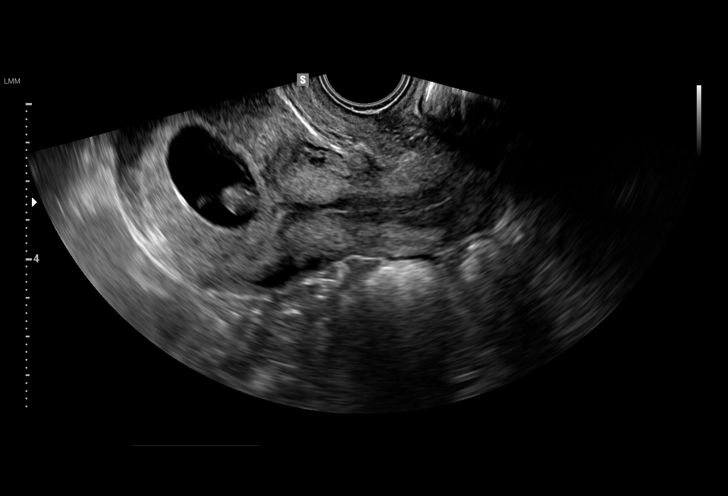
[im 3/37]
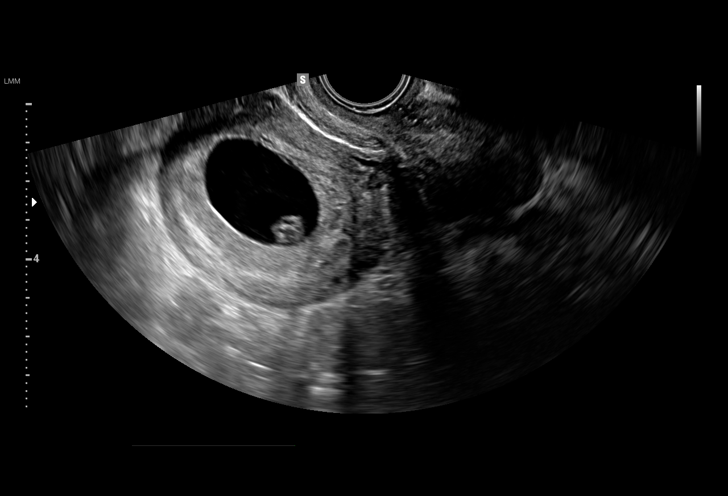
[im 6/37]
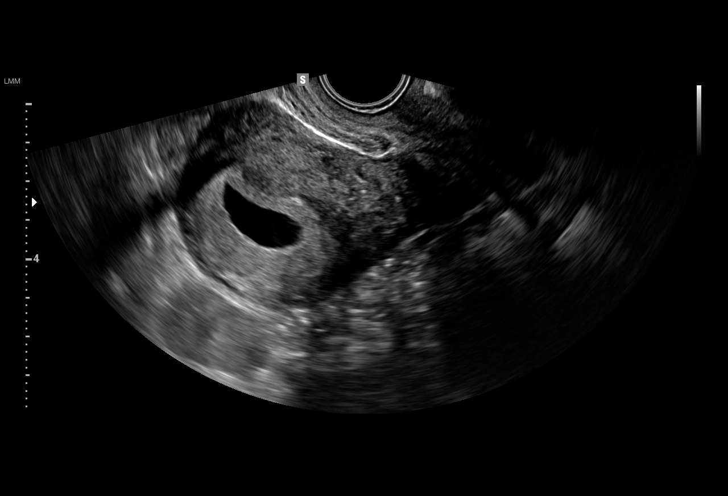
[im 9/37]
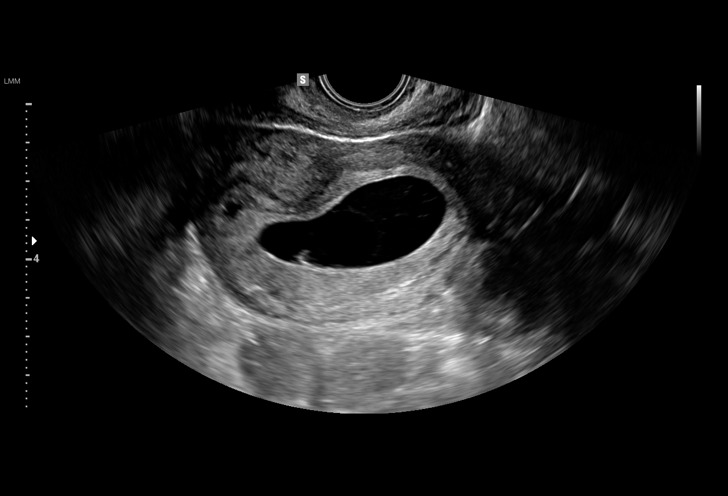
[im 11/37]
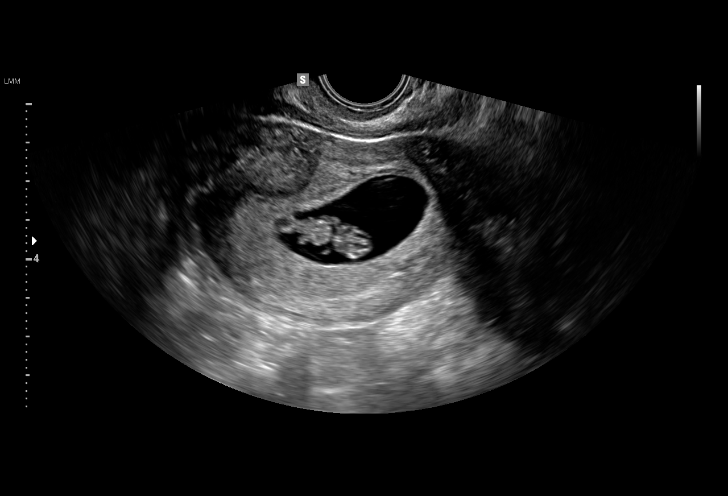
[im 14/37]
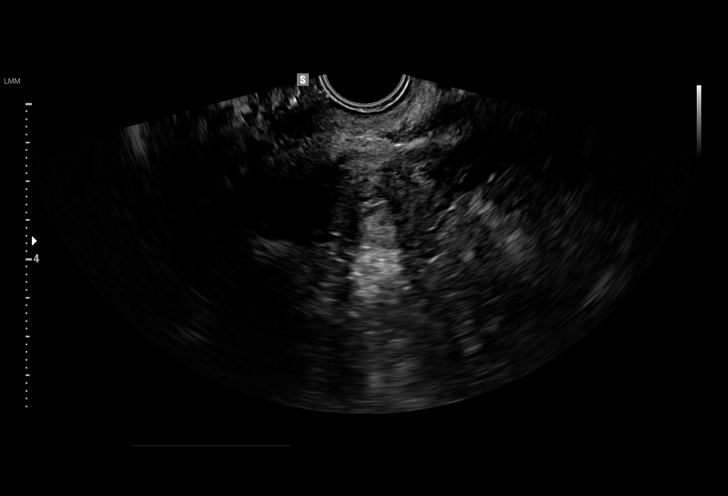
[im 17/37]
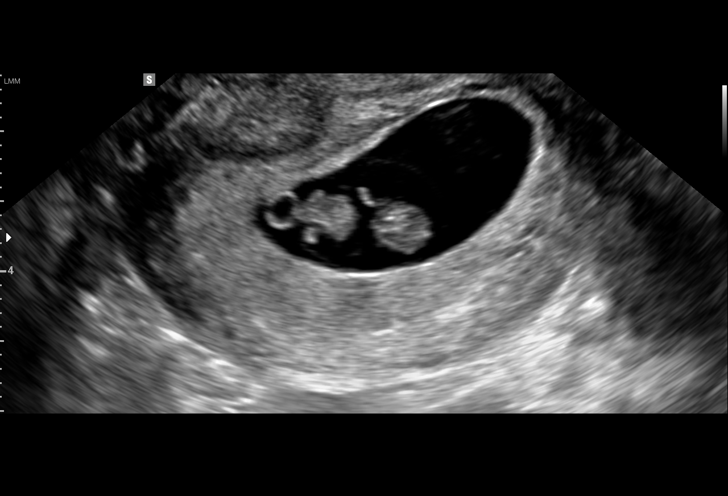
[im 19/37]
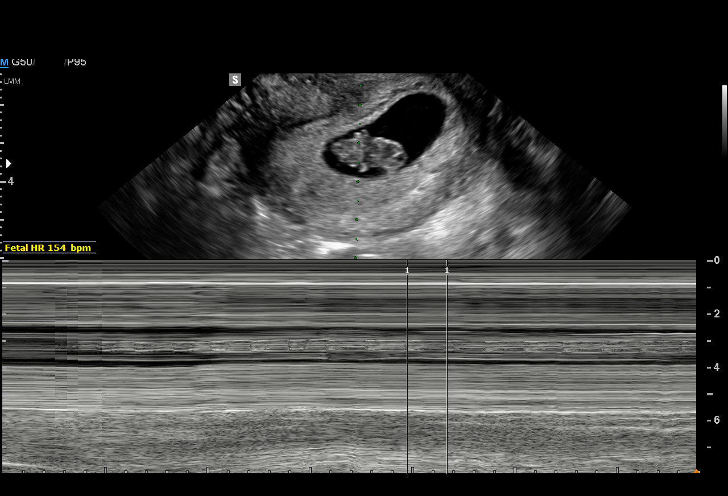
[im 21/37]
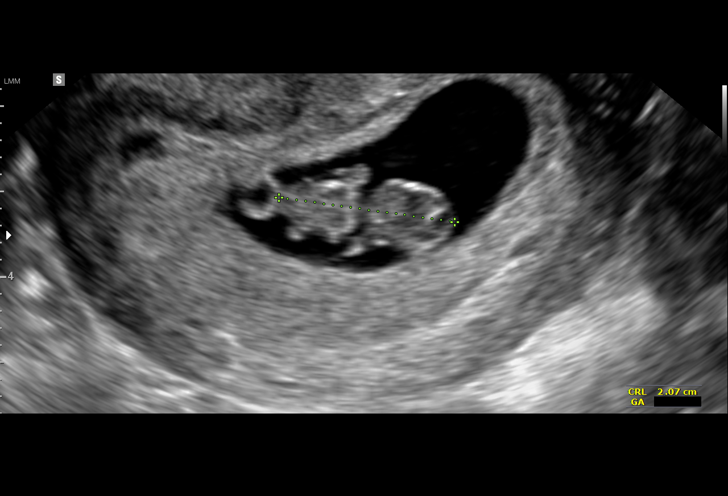
[im 23/37]
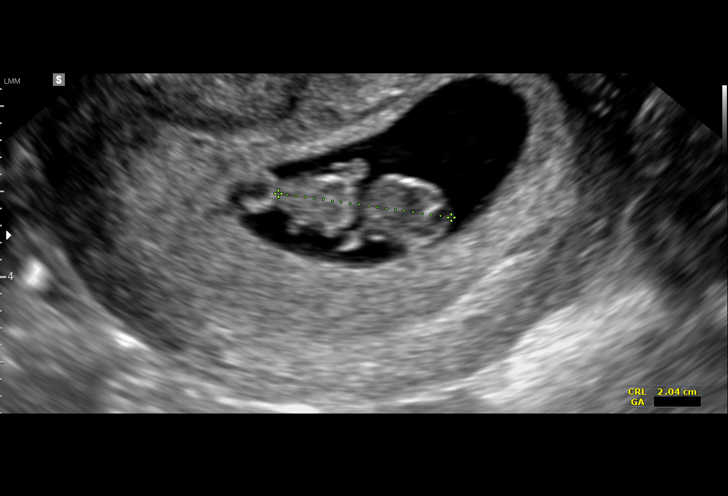
[im 26/37]
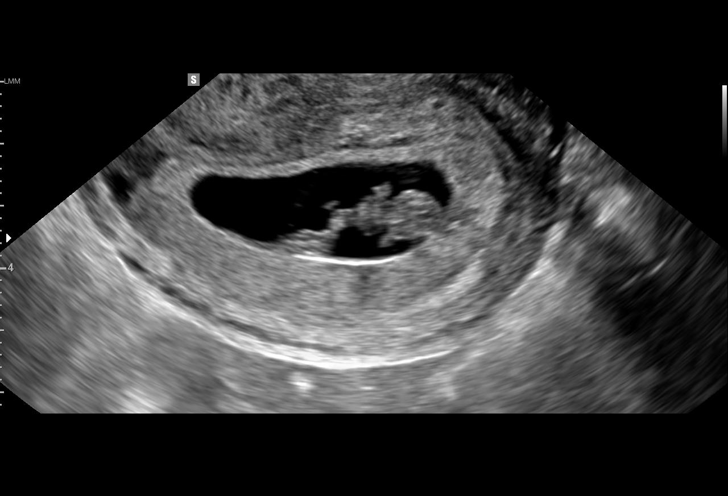
[im 29/37]
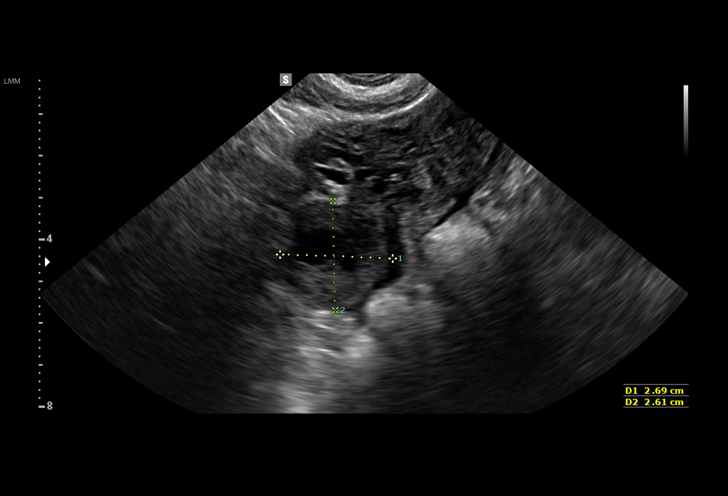
[im 31/37]
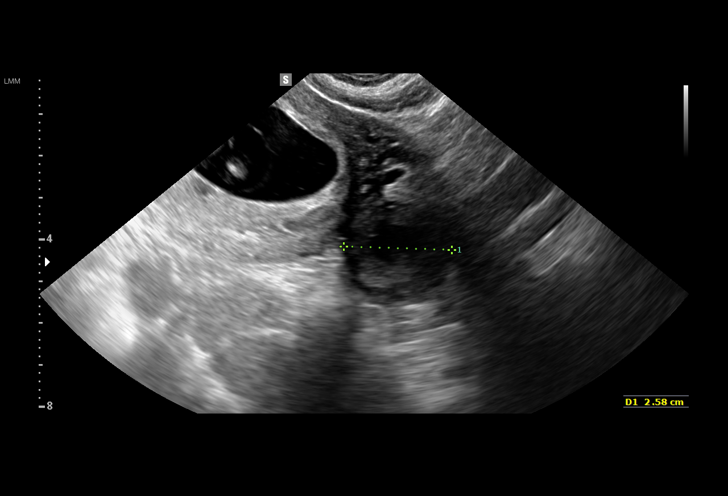
[im 34/37]
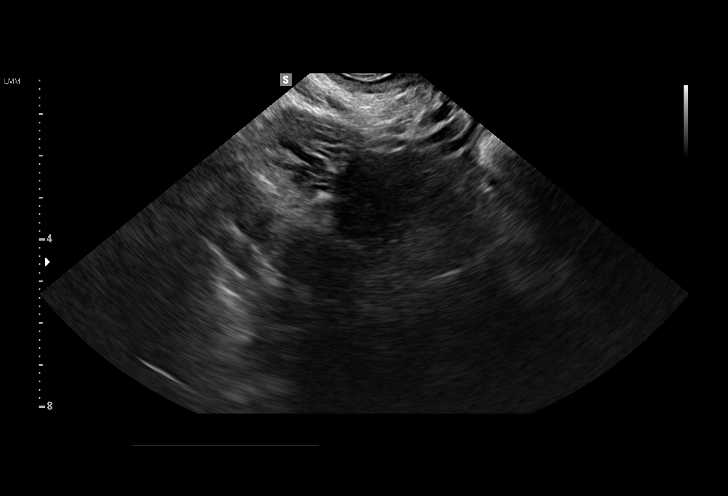
[im 37/37]
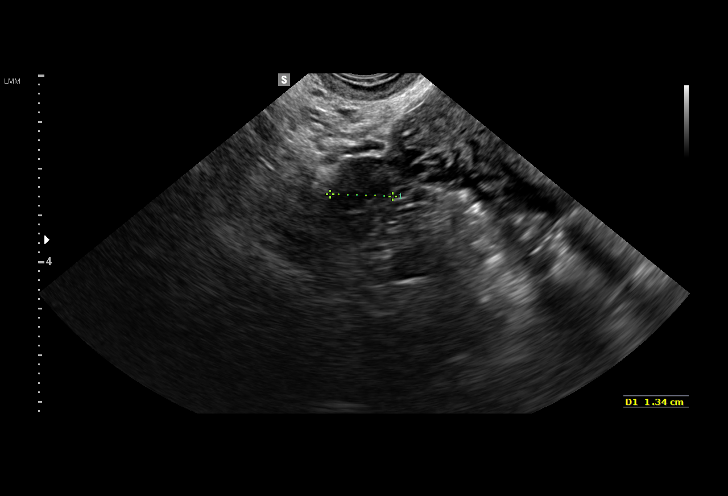

[15 of 28 positions shown; findings below may reference images not displayed]

FINDINGS: Intrauterine gestational sac: Single

Yolk sac:  Visualized

Embryo:  Visualized

Cardiac Activity: Visualized

Heart Rate: 154 bpm

CRL:   21  mm   8 w 5 d                  US EDC: 02/27/2017

Subchorionic hemorrhage:  None visualized.

Maternal uterus/adnexae: Both aphasia normal appearance. No mass or
free fluid identified
IMPRESSION: Single living IUP measuring 8 weeks 5 days. This shows appropriate
growth since prior study, and is concordant with LMP.

No significant maternal uterine or adnexal abnormality identified.

## 2017-10-23 ENCOUNTER — Encounter: Payer: Self-pay | Admitting: General Practice

## 2017-10-23 ENCOUNTER — Ambulatory Visit (INDEPENDENT_AMBULATORY_CARE_PROVIDER_SITE_OTHER): Payer: Medicaid Other | Admitting: General Practice

## 2017-10-23 ENCOUNTER — Inpatient Hospital Stay (HOSPITAL_COMMUNITY)
Admission: AD | Admit: 2017-10-23 | Discharge: 2017-10-23 | Disposition: A | Discharge status: Home / Self Care | Payer: Medicaid Other | Source: Ambulatory Visit | Attending: Family Medicine | Admitting: Family Medicine

## 2017-10-23 DIAGNOSIS — N912 Amenorrhea, unspecified: Secondary | ICD-10-CM | POA: Insufficient documentation

## 2017-10-23 DIAGNOSIS — Z3201 Encounter for pregnancy test, result positive: Secondary | ICD-10-CM | POA: Diagnosis present

## 2017-10-23 DIAGNOSIS — N926 Irregular menstruation, unspecified: Secondary | ICD-10-CM

## 2017-10-23 DIAGNOSIS — Z3202 Encounter for pregnancy test, result negative: Secondary | ICD-10-CM

## 2017-10-23 LAB — POCT PREGNANCY, URINE: Preg Test, Ur: POSITIVE — AB

## 2017-10-23 NOTE — MAU Provider Note (Signed)
  S:    Medical screen done.  21 y.o. G1P0010 @Unknown  by LMP presents to MAU for pregnancy confirmation.  She denies abdominal pain or vaginal bleeding today.    O: There were no vitals taken for this visit. Physical Examination: General appearance - alert, well appearing, and in no distress, oriented to person, place, and time and acyanotic, in no respiratory distress  No results found for this or any previous visit (from the past 48 hour(s)).  A: Missed menses  P: D/C home Informed pt we do not do pregnancy verifications in MAU [facility] Return to MAU as needed for pregnancy related emergencies  Venia CarbonJennifer Levette Paulick, NP 3:56 PM

## 2017-10-23 NOTE — MAU Note (Signed)
Patient presents for pregnancy test and confirmation of weeks. Rayfield Citizenaroline, CNM discussing plan of care with patient in triage.
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
## 2017-11-18 ENCOUNTER — Inpatient Hospital Stay (HOSPITAL_COMMUNITY)
Admission: AD | Admit: 2017-11-18 | Discharge: 2017-11-18 | Disposition: A | Discharge status: Home / Self Care | Payer: Medicaid Other | Source: Ambulatory Visit | Attending: Obstetrics and Gynecology | Admitting: Obstetrics and Gynecology

## 2017-11-18 ENCOUNTER — Encounter (HOSPITAL_COMMUNITY): Payer: Self-pay | Admitting: *Deleted

## 2017-11-18 ENCOUNTER — Inpatient Hospital Stay (HOSPITAL_COMMUNITY): Payer: Medicaid Other

## 2017-11-18 DIAGNOSIS — R1033 Periumbilical pain: Secondary | ICD-10-CM

## 2017-11-18 DIAGNOSIS — R109 Unspecified abdominal pain: Secondary | ICD-10-CM | POA: Diagnosis present

## 2017-11-18 DIAGNOSIS — Z87891 Personal history of nicotine dependence: Secondary | ICD-10-CM | POA: Diagnosis not present

## 2017-11-18 DIAGNOSIS — O209 Hemorrhage in early pregnancy, unspecified: Secondary | ICD-10-CM | POA: Diagnosis not present

## 2017-11-18 DIAGNOSIS — O208 Other hemorrhage in early pregnancy: Secondary | ICD-10-CM | POA: Diagnosis not present

## 2017-11-18 DIAGNOSIS — O26891 Other specified pregnancy related conditions, first trimester: Secondary | ICD-10-CM | POA: Diagnosis not present

## 2017-11-18 DIAGNOSIS — Z6791 Unspecified blood type, Rh negative: Secondary | ICD-10-CM | POA: Insufficient documentation

## 2017-11-18 DIAGNOSIS — Z3A09 9 weeks gestation of pregnancy: Secondary | ICD-10-CM | POA: Insufficient documentation

## 2017-11-18 LAB — RAPID URINE DRUG SCREEN, HOSP PERFORMED
Amphetamines: NOT DETECTED
Barbiturates: NOT DETECTED
Benzodiazepines: NOT DETECTED
Cocaine: NOT DETECTED
Opiates: NOT DETECTED
Tetrahydrocannabinol: POSITIVE — AB

## 2017-11-18 LAB — WET PREP, GENITAL
Sperm: NONE SEEN
Yeast Wet Prep HPF POC: NONE SEEN

## 2017-11-18 LAB — URINALYSIS, ROUTINE W REFLEX MICROSCOPIC
Bilirubin Urine: NEGATIVE
Glucose, UA: NEGATIVE mg/dL
Ketones, ur: 80 mg/dL — AB
Nitrite: NEGATIVE
Protein, ur: 100 mg/dL — AB
Specific Gravity, Urine: 1.026 (ref 1.005–1.030)
pH: 7 (ref 5.0–8.0)

## 2017-11-18 LAB — CBC
HCT: 39.8 % (ref 36.0–46.0)
Hemoglobin: 13.5 g/dL (ref 12.0–15.0)
MCH: 30.6 pg (ref 26.0–34.0)
MCHC: 33.9 g/dL (ref 30.0–36.0)
MCV: 90.2 fL (ref 78.0–100.0)
Platelets: 294 10*3/uL (ref 150–400)
RBC: 4.41 MIL/uL (ref 3.87–5.11)
RDW: 14.1 % (ref 11.5–15.5)
WBC: 12.5 10*3/uL — ABNORMAL HIGH (ref 4.0–10.5)

## 2017-11-18 LAB — HCG, QUANTITATIVE, PREGNANCY: hCG, Beta Chain, Quant, S: 6141 m[IU]/mL — ABNORMAL HIGH (ref <5)

## 2017-11-18 MED ORDER — OXYCODONE-ACETAMINOPHEN 5-325 MG PO TABS: 1.0000 | ORAL_TABLET | Freq: Four times a day (QID) | ORAL | 0 refills | Status: DC | PRN

## 2017-11-18 MED ORDER — RHO D IMMUNE GLOBULIN 1500 UNIT/2ML IJ SOSY
300.0000 ug | PREFILLED_SYRINGE | Freq: Once | INTRAMUSCULAR | Status: AC
  Administered 2017-11-18: 300 ug via INTRAMUSCULAR
  Filled 2017-11-18: qty 2

## 2017-11-18 MED ORDER — PROMETHAZINE HCL 25 MG/ML IJ SOLN
25.0000 mg | Freq: Once | INTRAVENOUS | Status: AC
  Administered 2017-11-18: 25 mg via INTRAVENOUS
  Filled 2017-11-18: qty 1

## 2017-11-18 MED ORDER — FENTANYL CITRATE (PF) 100 MCG/2ML IJ SOLN
25.0000 ug | Freq: Once | INTRAMUSCULAR | Status: AC
  Administered 2017-11-18: 25 ug via INTRAVENOUS
  Filled 2017-11-18: qty 2

## 2017-11-18 MED ORDER — PROMETHAZINE HCL 25 MG PO TABS: 25.0000 mg | ORAL_TABLET | Freq: Four times a day (QID) | ORAL | 0 refills | Status: DC | PRN

## 2017-11-18 MED ORDER — PROMETHAZINE HCL 25 MG/ML IJ SOLN: 25.0000 mg | Freq: Once | INTRAMUSCULAR | Status: DC

## 2017-11-18 MED ORDER — METRONIDAZOLE 500 MG PO TABS: 500.0000 mg | ORAL_TABLET | Freq: Two times a day (BID) | ORAL | 0 refills | Status: DC

## 2017-11-18 NOTE — MAU Provider Note (Signed)
Chief Complaint: Vaginal Bleeding and Abdominal Pain   SUBJECTIVE HPI: Vicki Daniel is a 21 y.o. G2P0010 at [redacted]w[redacted]d who presents to MAU via EMS with complaints of severe sharp abdominal pain.  Localizes abdominal pain to the periumbilical area.  Patient denies having abdominal pain like this in the past.  She states that she is currently about [redacted] weeks pregnant based off LMP.  Patient states abdominal pain started 2 days ago.  Has been worsening.  She is also been having nausea vomiting and no appetite.  Patient states that vaginal bleeding is heavy with clots.   Past Medical History:  Diagnosis Date  . Asthma    OB History  Gravida Para Term Preterm AB Living  2 0 0 0 1 0  SAB TAB Ectopic Multiple Live Births  1 0 0 0 0    # Outcome Date GA Lbr Len/2nd Weight Sex Delivery Anes PTL Lv  2 Current           1 SAB              Past Surgical History:  Procedure Laterality Date  . WISDOM TOOTH EXTRACTION     Social History   Socioeconomic History  . Marital status: Single    Spouse name: Not on file  . Number of children: Not on file  . Years of education: Not on file  . Highest education level: Not on file  Social Needs  . Financial resource strain: Not on file  . Food insecurity - worry: Not on file  . Food insecurity - inability: Not on file  . Transportation needs - medical: Not on file  . Transportation needs - non-medical: Not on file  Occupational History  . Not on file  Tobacco Use  . Smoking status: Former Smoker    Last attempt to quit: 04/22/2016    Years since quitting: 1.5  . Smokeless tobacco: Never Used  Substance and Sexual Activity  . Alcohol use: Yes  . Drug use: Yes    Types: Marijuana  . Sexual activity: Yes    Birth control/protection: None  Other Topics Concern  . Not on file  Social History Narrative  . Not on file   No current facility-administered medications on file prior to encounter.    Current Outpatient Medications on File Prior to  Encounter  Medication Sig Dispense Refill  . albuterol (PROVENTIL HFA;VENTOLIN HFA) 108 (90 Base) MCG/ACT inhaler Inhale 2 puffs into the lungs every 6 (six) hours as needed for wheezing or shortness of breath.    . ethynodiol-ethinyl estradiol (KELNOR,ZOVIA) 1-35 MG-MCG tablet Take 1 tablet by mouth daily.    . ondansetron (ZOFRAN) 4 MG tablet Take 1 tablet (4 mg total) by mouth every 8 (eight) hours as needed for nausea or vomiting. 6 tablet 0   No Known Allergies  I have reviewed the past Medical Hx, Surgical Hx, Social Hx, Allergies and Medications.   REVIEW OF SYSTEMS All systems reviewed and are negative for acute change except as noted in the HPI.   OBJECTIVE BP 112/65   Pulse 64   Resp (!) 24   LMP 09/16/2017    PHYSICAL EXAM Constitutional: Well-developed, well-nourished female in no acute distress.  O/P: dry mucous membranes Cardiovascular: normal rate and rhythm, pulses intact Respiratory: normal rate and effort.  GI: Abd soft, non-tender, non-distended. Pos BS x 4 MS: Extremities nontender, no edema, normal ROM Neurologic: Alert and oriented x 4. No focal deficits SPECULUM EXAM: NEFG, dark  bloody vaginal discharge with odor, cervix clean BIMANUAL: cervix 1cm dialted; uterus normal size, no adnexal tenderness or masses. No CMT. Psych: normal mood and affect  LAB RESULTS Results for orders placed or performed during the hospital encounter of 11/18/17 (from the past 24 hour(s))  Urinalysis, Routine w reflex microscopic     Status: Abnormal   Collection Time: 11/18/17  4:00 PM  Result Value Ref Range   Color, Urine YELLOW YELLOW   APPearance CLOUDY (A) CLEAR   Specific Gravity, Urine 1.026 1.005 - 1.030   pH 7.0 5.0 - 8.0   Glucose, UA NEGATIVE NEGATIVE mg/dL   Hgb urine dipstick LARGE (A) NEGATIVE   Bilirubin Urine NEGATIVE NEGATIVE   Ketones, ur 80 (A) NEGATIVE mg/dL   Protein, ur 161100 (A) NEGATIVE mg/dL   Nitrite NEGATIVE NEGATIVE   Leukocytes, UA MODERATE  (A) NEGATIVE   RBC / HPF TOO NUMEROUS TO COUNT 0 - 5 RBC/hpf   WBC, UA TOO NUMEROUS TO COUNT 0 - 5 WBC/hpf   Bacteria, UA RARE (A) NONE SEEN   Squamous Epithelial / LPF 6-30 (A) NONE SEEN   Mucus PRESENT   Urine rapid drug screen (hosp performed)     Status: Abnormal   Collection Time: 11/18/17  4:00 PM  Result Value Ref Range   Opiates NONE DETECTED NONE DETECTED   Cocaine NONE DETECTED NONE DETECTED   Benzodiazepines NONE DETECTED NONE DETECTED   Amphetamines NONE DETECTED NONE DETECTED   Tetrahydrocannabinol POSITIVE (A) NONE DETECTED   Barbiturates NONE DETECTED NONE DETECTED  hCG, quantitative, pregnancy     Status: Abnormal   Collection Time: 11/18/17  4:09 PM  Result Value Ref Range   hCG, Beta Chain, Quant, S 6,141 (H) <5 mIU/mL  CBC     Status: Abnormal   Collection Time: 11/18/17  4:09 PM  Result Value Ref Range   WBC 12.5 (H) 4.0 - 10.5 K/uL   RBC 4.41 3.87 - 5.11 MIL/uL   Hemoglobin 13.5 12.0 - 15.0 g/dL   HCT 09.639.8 04.536.0 - 40.946.0 %   MCV 90.2 78.0 - 100.0 fL   MCH 30.6 26.0 - 34.0 pg   MCHC 33.9 30.0 - 36.0 g/dL   RDW 81.114.1 91.411.5 - 78.215.5 %   Platelets 294 150 - 400 K/uL  Rh IG workup (includes ABO/Rh)     Status: None (Preliminary result)   Collection Time: 11/18/17  4:09 PM  Result Value Ref Range   Gestational Age(Wks) 9    ABO/RH(D) A NEG    Antibody Screen NEG    Unit Number [id number]    Blood Component Type RHIG    Unit division 00    Status of Unit ISSUED    Transfusion Status OK TO TRANSFUSE   Wet prep, genital     Status: Abnormal   Collection Time: 11/18/17  4:29 PM  Result Value Ref Range   Yeast Wet Prep HPF POC NONE SEEN NONE SEEN   Trich, Wet Prep PRESENT (A) NONE SEEN   Clue Cells Wet Prep HPF POC PRESENT (A) NONE SEEN   WBC, Wet Prep HPF POC MANY (A) NONE SEEN   Sperm NONE SEEN     IMAGING [place] Ob Comp Less 14 Wks  Result Date: 11/18/2017 CLINICAL DATA:  Pelvic pain and vaginal bleeding EXAM: OBSTETRIC <14 WK US AND TRANSVAGINAL OB US  TECHNIQUE: Both transabdominal and transvaginal ultrasound examinations were performed for complete evaluation of the gestation as well as the maternal uterus, adnexal regions, and  pelvic cul-de-sac. Transvaginal technique was performed to assess early pregnancy. COMPARISON:  None. FINDINGS: Intrauterine gestational sac: Not visualized Yolk sac:  Not visualized Embryo:  Not visualized Cardiac Activity: Not visualized Subchorionic hemorrhage:  None visualized. Maternal uterus/adnexae: Uterus measures 8.2 x 4.8 x 4.8 cm. There is no intrauterine mass. Endometrium measures 12 mm with smooth contour. Left ovary measures 2.2 x 1.7 x 1.8 cm. Right ovary measures 3.8 x 1.8 x 2.5 cm. There is no extrauterine pelvic or adnexal mass. No free pelvic fluid. IMPRESSION: No intrauterine gestation is apparent. Assuming positive pregnancy test, differential considerations must include intrauterine gestation too early to be seen by either transabdominal or transvaginal technique; recent spontaneous abortion; ectopic gestation. In this regard, close clinical and laboratory surveillance advised. Timing of repeat imaging will depend largely on beta HCG values going forward. No intrauterine or extrauterine pelvic or adnexal mass. No free pelvic fluid. Endometrium not appreciably thickened. Electronically Signed   By: Bretta BangWilliam  Woodruff III M.D.   On: 11/18/2017 17:28   [place] Ob Transvaginal  Result Date: 11/18/2017 CLINICAL DATA:  Pelvic pain and vaginal bleeding EXAM: OBSTETRIC <14 WK US AND TRANSVAGINAL OB US TECHNIQUE: Both transabdominal and transvaginal ultrasound examinations were performed for complete evaluation of the gestation as well as the maternal uterus, adnexal regions, and pelvic cul-de-sac. Transvaginal technique was performed to assess early pregnancy. COMPARISON:  None. FINDINGS: Intrauterine gestational sac: Not visualized Yolk sac:  Not visualized Embryo:  Not visualized Cardiac Activity: Not visualized Subchorionic  hemorrhage:  None visualized. Maternal uterus/adnexae: Uterus measures 8.2 x 4.8 x 4.8 cm. There is no intrauterine mass. Endometrium measures 12 mm with smooth contour. Left ovary measures 2.2 x 1.7 x 1.8 cm. Right ovary measures 3.8 x 1.8 x 2.5 cm. There is no extrauterine pelvic or adnexal mass. No free pelvic fluid. IMPRESSION: No intrauterine gestation is apparent. Assuming positive pregnancy test, differential considerations must include intrauterine gestation too early to be seen by either transabdominal or transvaginal technique; recent spontaneous abortion; ectopic gestation. In this regard, close clinical and laboratory surveillance advised. Timing of repeat imaging will depend largely on beta HCG values going forward. No intrauterine or extrauterine pelvic or adnexal mass. No free pelvic fluid. Endometrium not appreciably thickened. Electronically Signed   By: Bretta BangWilliam  Woodruff III M.D.   On: 11/18/2017 17:28    MAU COURSE Vitals and nursing notes reviewed I have ordered labs/imaging and reviewed them A neg - Rh workup initiated UA with signs of dehydration UDS pos for THC Hcg collected Wet prep positive for Trich and BV US without signs of IUP or ectopic pregnancy at this time  Treatments given in MAU: -Rhogam -IV pain med -Bolus IVF with phenergan  MDM Plan of care reviewed with patient, including labs and tests ordered and medical treatment.   ASSESSMENT 1. Vaginal bleeding affecting early pregnancy   2. Vaginal bleeding in pregnancy, first trimester   3. Abdominal pain during pregnancy in first trimester   4. Periumbilical abdominal pain   5. Rh negative status during pregnancy in first trimester     Most likely diagnosis is complete abortion  PLAN Discharge home in stable condition Strict return precautions given for ectopic pregnancy Follow-up in clinic on Monday for repeat Hcg Rx for Flagyl to treat Trich and BV Rx pain medications and antiemetic  Handout  given   Caryl AdaJazma Phelps, DO OB [facility], [facility] 11/18/2017, 4:05 PM

## 2017-11-18 NOTE — MAU Note (Signed)
Pt  Ireports being 9 weeks preg. Started having severe abd and heavy vaginal bleeding 2 days ago. Bleeding less today but pain worse. Not taking anything for the pain

## 2017-11-18 NOTE — Discharge Instructions (Signed)
[facility] for repeat blood work  Trichomoniasis Trichomoniasis is an STI (sexually transmitted infection) that can affect both women and men. In women, the outer area of the female genitalia (vulva) and the vagina are affected. In men, the penis is mainly affected, but the prostate and other reproductive organs can also be involved. This condition can be treated with medicine. It often has no symptoms (is asymptomatic), especially in men. What are the causes? This condition is caused by an organism called Trichomonas vaginalis. Trichomoniasis most often spreads from person to person (is contagious) through sexual contact. What increases the risk? The following factors may make you more likely to develop this condition:  Having unprotected sexual intercourse.  Having sexual intercourse with a partner who has trichomoniasis.  Having multiple sexual partners.  Having had previous trichomoniasis infections or other STIs.  What are the signs or symptoms? In women, symptoms of trichomoniasis include:  Abnormal vaginal discharge that is clear, white, gray, or yellow-green and foamy and has an unusual "fishy" odor.  Itching and irritation of the vagina and vulva.  Burning or pain during urination or sexual intercourse.  Genital redness and swelling.  In men, symptoms of trichomoniasis include:  Penile discharge that may be foamy or contain pus.  Pain in the penis. This may happen only when urinating.  Itching or irritation inside the penis.  Burning after urination or ejaculation.  How is this diagnosed? In women, this condition may be found during a routine Pap test or physical exam. It may be found in men during a routine physical exam. Your health care provider may perform tests to help diagnose this infection, such as:  Urine tests (men and women).  The following in women: ? Testing the pH of the vagina. ? A vaginal swab test that checks for  the Trichomonas vaginalis organism. ? Testing vaginal secretions.  Your health care provider may test you for other STIs, including HIV (human immunodeficiency virus). How is this treated? This condition is treated with medicine taken by mouth (orally), such as metronidazole or tinidazole to fight the infection. Your sexual partner(s) may also need to be tested and treated.  If you are a woman and you plan to become pregnant or think you may be pregnant, tell your health care provider right away. Some medicines that are used to treat the infection should not be taken during pregnancy.  Your health care provider may recommend over-the-counter medicines or creams to help relieve itching or irritation. You may be tested for infection again 3 months after treatment. Follow these instructions at home:  Take and use over-the-counter and prescription medicines, including creams, only as told by your health care provider.  Do not have sexual intercourse until one week after you finish your medicine, or until your health care provider approves. Ask your health care provider when you may resume sexual intercourse.  (Women) Do not douche or wear tampons while you have the infection.  Discuss your infection with your sexual partner(s). Make sure that your partner gets tested and treated, if necessary.  Keep all follow-up visits as told by your health care provider. This is important. How is this prevented?  Use condoms every time you have sex. Using condoms correctly and consistently can help protect against STIs.  Avoid having multiple sexual partners.  Talk with your sexual partner about any symptoms that either of you may have, as well as any history of STIs.  Get tested for STIs  and STDs (sexually transmitted diseases) before you have sex. Ask your partner to do the same.  Do not have sexual contact if you have symptoms of trichomoniasis or another STI. Contact a health care provider  if:  You still have symptoms after you finish your medicine.  You develop pain in your abdomen.  You have pain when you urinate.  You have bleeding after sexual intercourse.  You develop a rash.  You feel nauseous or you vomit.  You plan to become pregnant or think you may be pregnant. Summary  Trichomoniasis is an STI (sexually transmitted infection) that can affect both women and men.  This condition often has no symptoms (is asymptomatic), especially in men.  You should not have sexual intercourse until one week after you finish your medicine, or until your health care provider approves. Ask your health care provider when you may resume sexual intercourse.  Discuss your infection with your sexual partner. Make sure that your partner gets tested and treated, if necessary. This information is not intended to replace advice given to you by your health care provider. Make sure you discuss any questions you have with your health care provider. Document Released: 05/24/2001 Document Revised: 10/21/2016 Document Reviewed: 10/21/2016 Elsevier Interactive Patient Education  2017 [employer]

## 2017-11-19 LAB — RH IG WORKUP (INCLUDES ABO/RH)
ABO/RH(D): A NEG
Antibody Screen: NEGATIVE
Gestational Age(Wks): 9
Unit division: 0

## 2017-11-20 ENCOUNTER — Inpatient Hospital Stay (HOSPITAL_COMMUNITY): Payer: Medicaid Other

## 2017-11-20 ENCOUNTER — Inpatient Hospital Stay (HOSPITAL_COMMUNITY)
Admission: AD | Admit: 2017-11-20 | Discharge: 2017-11-20 | Disposition: A | Discharge status: Home / Self Care | Payer: Medicaid Other | Source: Ambulatory Visit | Attending: Obstetrics and Gynecology | Admitting: Obstetrics and Gynecology

## 2017-11-20 ENCOUNTER — Encounter (HOSPITAL_COMMUNITY): Payer: Self-pay

## 2017-11-20 ENCOUNTER — Other Ambulatory Visit: Payer: Self-pay

## 2017-11-20 DIAGNOSIS — Z87891 Personal history of nicotine dependence: Secondary | ICD-10-CM | POA: Diagnosis not present

## 2017-11-20 DIAGNOSIS — Z3A09 9 weeks gestation of pregnancy: Secondary | ICD-10-CM | POA: Diagnosis not present

## 2017-11-20 DIAGNOSIS — O039 Complete or unspecified spontaneous abortion without complication: Secondary | ICD-10-CM | POA: Diagnosis not present

## 2017-11-20 DIAGNOSIS — O4690 Antepartum hemorrhage, unspecified, unspecified trimester: Secondary | ICD-10-CM

## 2017-11-20 DIAGNOSIS — O209 Hemorrhage in early pregnancy, unspecified: Secondary | ICD-10-CM | POA: Diagnosis present

## 2017-11-20 LAB — URINALYSIS, ROUTINE W REFLEX MICROSCOPIC
Bilirubin Urine: NEGATIVE
Glucose, UA: NEGATIVE mg/dL
Ketones, ur: 5 mg/dL — AB
Nitrite: NEGATIVE
Protein, ur: 100 mg/dL — AB
Specific Gravity, Urine: 1.019 (ref 1.005–1.030)
pH: 7 (ref 5.0–8.0)

## 2017-11-20 LAB — CBC
HCT: 37 % (ref 36.0–46.0)
Hemoglobin: 12.4 g/dL (ref 12.0–15.0)
MCH: 30.2 pg (ref 26.0–34.0)
MCHC: 33.5 g/dL (ref 30.0–36.0)
MCV: 90.2 fL (ref 78.0–100.0)
Platelets: 232 10*3/uL (ref 150–400)
RBC: 4.1 MIL/uL (ref 3.87–5.11)
RDW: 14 % (ref 11.5–15.5)
WBC: 11.1 10*3/uL — ABNORMAL HIGH (ref 4.0–10.5)

## 2017-11-20 LAB — COMPREHENSIVE METABOLIC PANEL
ALT: 12 U/L — ABNORMAL LOW (ref 14–54)
AST: 19 U/L (ref 15–41)
Albumin: 3.7 g/dL (ref 3.5–5.0)
Alkaline Phosphatase: 40 U/L (ref 38–126)
Anion gap: 14 (ref 5–15)
BUN: 8 mg/dL (ref 6–20)
CO2: 21 mmol/L — ABNORMAL LOW (ref 22–32)
Calcium: 8.5 mg/dL — ABNORMAL LOW (ref 8.9–10.3)
Chloride: 103 mmol/L (ref 101–111)
Creatinine, Ser: 0.71 mg/dL (ref 0.44–1.00)
GFR calc Af Amer: >60 mL/min (ref >60)
GFR calc non Af Amer: >60 mL/min (ref >60)
Glucose, Bld: 96 mg/dL (ref 65–99)
Potassium: 3.3 mmol/L — ABNORMAL LOW (ref 3.5–5.1)
Sodium: 138 mmol/L (ref 135–145)
Total Bilirubin: 0.6 mg/dL (ref 0.3–1.2)
Total Protein: 6.6 g/dL (ref 6.5–8.1)

## 2017-11-20 LAB — HCG, QUANTITATIVE, PREGNANCY: hCG, Beta Chain, Quant, S: 1311 m[IU]/mL — ABNORMAL HIGH (ref <5)

## 2017-11-20 MED ORDER — SODIUM CHLORIDE 0.9 % IV SOLN
8.0000 mg | Freq: Once | INTRAVENOUS | Status: AC
  Administered 2017-11-20: 8 mg via INTRAVENOUS
  Filled 2017-11-20: qty 4

## 2017-11-20 MED ORDER — LACTATED RINGERS IV BOLUS (SEPSIS)
1000.0000 mL | Freq: Once | INTRAVENOUS | Status: AC
  Administered 2017-11-20: 1000 mL via INTRAVENOUS

## 2017-11-20 MED ORDER — HYDROMORPHONE HCL 1 MG/ML IJ SOLN
1.0000 mg | INTRAMUSCULAR | Status: DC | PRN
  Administered 2017-11-20: 1 mg via INTRAVENOUS
  Filled 2017-11-20: qty 1

## 2017-11-20 NOTE — Progress Notes (Addendum)
Assumed care of pt from RN Millner.  Pt is G2P0 @ 9.[redacted] wksga. Came to triage for c/o bleeding and severe abd cramping.   Medicated for 10'10 pain. Zofran PB up  2137: to U/s via wheelchair.  2200 back from U/s  Pt states decreased pain and nausea  Drink and cracker provided. Pt tolerated well.   Provider at bs reassessing and discussing POC  2251: D/C instructions given with pt understanding. Pt left unit via ambulatory

## 2017-11-20 NOTE — MAU Note (Signed)
Pt. Here a few days ago, currently back due to inability to keep down the medicine that was given for BVD. Currently patient is complaining of vaginal bleeding and cramping.  Pain 11/10, abdominal cramping and sharp pain. No active bleeding noted, moderate amount of blood on peri pad.

## 2017-11-20 NOTE — Discharge Instructions (Signed)
[phone]   Miscarriage A miscarriage is the loss of an unborn baby (fetus) before the 20th week of pregnancy. The cause is often unknown. Follow these instructions at home:  You may need to stay in bed (bed rest), or you may be able to do light activity. Go about activity as told by your doctor.  Have help at home.  Write down how many pads you use each day. Write down how soaked they are.  Do not use tampons. Do not wash out your vagina (douche) or have sex (intercourse) until your doctor approves.  Only take medicine as told by your doctor.  Do not take aspirin.  Keep all doctor visits as told.  If you or your partner have problems with grieving, talk to your doctor. You can also try counseling. Give yourself time to grieve before trying to get pregnant again. Get help right away if:  You have bad cramps or pain in your back or belly (abdomen).  You have a fever.  You pass large clumps of blood (clots) from your vagina that are walnut-sized or larger. Save the clumps for your doctor to see.  You pass large amounts of tissue from your vagina. Save the tissue for your doctor to see.  You have more bleeding.  You have thick, bad-smelling fluid (discharge) coming from the vagina.  You get lightheaded, weak, or you pass out (faint).  You have chills. This information is not intended to replace advice given to you by your health care provider. Make sure you discuss any questions you have with your health care provider. Document Released: 02/20/2012 Document Revised: 05/05/2016 Document Reviewed: 12/29/2011 Elsevier Interactive Patient Education  2017 [employer]

## 2017-11-20 NOTE — MAU Provider Note (Signed)
History     CSN: [id number]  Arrival date and time: 11/20/17 2015   First Provider Initiated Contact with Patient 11/20/17 2041      Chief Complaint  Patient presents with  . Abdominal Pain  . Vaginal Bleeding   G2P0010 @[redacted]w[redacted]d  by LMP here with VB and LAP. Pain started earlier today. Describes as sharp and constant in her upper abdomen and suprapubic area. Bleeding has been ongoing since she was seen here 2 days. Describes as drk blood, changing pads every 3-4 hrs. She tried Percocet for her pain 2 hrs ago but vomited shortly later. She is unable to tolerate anything po. She took Phenergan this am but it did not help. She was seen 2 days ago in MAU with Vb and pain and no IUP was seen on US, quant HCG was 6k.   OB History    Gravida Para Term Preterm AB Living   2 0 0 0 1 0   SAB TAB Ectopic Multiple Live Births   1 0 0 0 0      Past Medical History:  Diagnosis Date  . Asthma     Past Surgical History:  Procedure Laterality Date  . WISDOM TOOTH EXTRACTION      No family history on file.  Social History   Tobacco Use  . Smoking status: Former Smoker    Last attempt to quit: 04/22/2016    Years since quitting: 1.5  . Smokeless tobacco: Never Used  Substance Use Topics  . Alcohol use: Yes  . Drug use: Yes    Types: Marijuana    Allergies: No Known Allergies  Medications Prior to Admission  Medication Sig Dispense Refill Last Dose  . metroNIDAZOLE (FLAGYL) 500 MG tablet Take 1 tablet (500 mg total) by mouth 2 (two) times daily for 7 days. 14 tablet 0   . oxyCODONE-acetaminophen (PERCOCET/ROXICET) 5-325 MG tablet Take 1 tablet by mouth every 6 (six) hours as needed for severe pain. 10 tablet 0   . promethazine (PHENERGAN) 25 MG tablet Take 1 tablet (25 mg total) by mouth every 6 (six) hours as needed for nausea or vomiting. 15 tablet 0     Review of Systems  Constitutional: Negative for fever.  Gastrointestinal: Positive for abdominal pain, nausea and vomiting.   Genitourinary: Positive for vaginal bleeding.   Physical Exam   Blood pressure 128/84, pulse (!) 57, temperature 98.7 F (37.1 C), temperature source Oral, resp. rate 18, height 5\' 9"  (1.753 m), weight 180 lb (81.6 kg), last menstrual period 09/16/2017, unknown if currently breastfeeding.  Physical Exam  Nursing note and vitals reviewed. Constitutional: She is oriented to person, place, and time. She appears well-developed and well-nourished. Distressed: uncomfortable.  HENT:  Head: Normocephalic and atraumatic.  Neck: Normal range of motion.  Cardiovascular: Normal rate.  Respiratory: Effort normal. No respiratory distress.  GI: Soft. She exhibits no distension and no mass. There is no tenderness. There is no rebound and no guarding.  Genitourinary:  Genitourinary Comments: External: no lesions or erythema Vagina: rugated, pink, moist, bloody discharge, POCs seen protruding from os, removed with ring forcep  Uterus: non enlarged, anteverted, non tender, no CMT Adnexae: no masses, no tenderness left, no tenderness right   Musculoskeletal: Normal range of motion.  Neurological: She is alert and oriented to person, place, and time.  Skin: Skin is warm and dry.  Psychiatric: She has a normal mood and affect.   Results for orders placed or performed during the hospital encounter of  11/20/17 (from the past 24 hour(s))  Urinalysis, Routine w reflex microscopic     Status: Abnormal   Collection Time: 11/20/17  8:20 PM  Result Value Ref Range   Color, Urine YELLOW YELLOW   APPearance HAZY (A) CLEAR   Specific Gravity, Urine 1.019 1.005 - 1.030   pH 7.0 5.0 - 8.0   Glucose, UA NEGATIVE NEGATIVE mg/dL   Hgb urine dipstick LARGE (A) NEGATIVE   Bilirubin Urine NEGATIVE NEGATIVE   Ketones, ur 5 (A) NEGATIVE mg/dL   Protein, ur 962100 (A) NEGATIVE mg/dL   Nitrite NEGATIVE NEGATIVE   Leukocytes, UA MODERATE (A) NEGATIVE   RBC / HPF TOO NUMEROUS TO COUNT 0 - 5 RBC/hpf   WBC, UA TOO NUMEROUS  TO COUNT 0 - 5 WBC/hpf   Bacteria, UA RARE (A) NONE SEEN   Squamous Epithelial / LPF 0-5 (A) NONE SEEN   Mucus PRESENT   CBC     Status: Abnormal   Collection Time: 11/20/17  9:05 PM  Result Value Ref Range   WBC 11.1 (H) 4.0 - 10.5 K/uL   RBC 4.10 3.87 - 5.11 MIL/uL   Hemoglobin 12.4 12.0 - 15.0 g/dL   HCT 95.237.0 84.136.0 - 32.446.0 %   MCV 90.2 78.0 - 100.0 fL   MCH 30.2 26.0 - 34.0 pg   MCHC 33.5 30.0 - 36.0 g/dL   RDW 40.114.0 02.711.5 - 25.315.5 %   Platelets 232 150 - 400 K/uL  hCG, quantitative, pregnancy     Status: Abnormal   Collection Time: 11/20/17  9:05 PM  Result Value Ref Range   hCG, Beta Chain, Quant, S 1,311 (H) <5 mIU/mL   MAU Course  Procedures LR 1 L Dilaudid Zofran  MDM Labs and US ordered. Transfer of care given to Karn CassisKooistra, CNM Bhambri, Melanie, CNM  11/20/2017 10:00 PM    Patient's US shows that miscarriage is complete; no gestastional sac, no retained POC. Patient feeling better after Dilaudid; ready for discharge. Patient does not feel that she needs a follow-up appt in two weeks, but number given for [facility] clinic just in case.    Assessment and Plan   1. Patient stable for discharge after US showed no POC left in the uterus.  2. Instructions on miscarriage after-care given; warning precautions reviewed.  3. Patient will start taking her flagyl for BV and trich tonight; reviewed how to take medication (with food). 4. Patient given number of [facility] to call for follow-up appointment if she believes she needs it.  5. Patient and mother verbalized understanding. No further questions.   Luna KitchensKathryn Amarius Toto

## 2017-11-20 NOTE — MAU Note (Signed)
End of shift report given to Morgan's Point ResortJaneen, Charity fundraiserN.

## 2017-11-21 ENCOUNTER — Inpatient Hospital Stay (HOSPITAL_COMMUNITY)
Admission: AD | Admit: 2017-11-21 | Discharge: 2017-11-22 | Disposition: A | Discharge status: Home / Self Care | Payer: Medicaid Other | Source: Ambulatory Visit | Attending: Obstetrics & Gynecology | Admitting: Obstetrics & Gynecology

## 2017-11-21 ENCOUNTER — Encounter (HOSPITAL_COMMUNITY): Payer: Self-pay

## 2017-11-21 DIAGNOSIS — E876 Hypokalemia: Secondary | ICD-10-CM | POA: Diagnosis not present

## 2017-11-21 DIAGNOSIS — K2100 Gastro-esophageal reflux disease with esophagitis, without bleeding: Secondary | ICD-10-CM

## 2017-11-21 DIAGNOSIS — R112 Nausea with vomiting, unspecified: Secondary | ICD-10-CM | POA: Diagnosis not present

## 2017-11-21 DIAGNOSIS — O219 Vomiting of pregnancy, unspecified: Secondary | ICD-10-CM

## 2017-11-21 DIAGNOSIS — Z87891 Personal history of nicotine dependence: Secondary | ICD-10-CM | POA: Diagnosis not present

## 2017-11-21 LAB — CBC
HCT: 36 % (ref 36.0–46.0)
Hemoglobin: 12.3 g/dL (ref 12.0–15.0)
MCH: 30.9 pg (ref 26.0–34.0)
MCHC: 34.2 g/dL (ref 30.0–36.0)
MCV: 90.5 fL (ref 78.0–100.0)
Platelets: 219 10*3/uL (ref 150–400)
RBC: 3.98 MIL/uL (ref 3.87–5.11)
RDW: 14.1 % (ref 11.5–15.5)
WBC: 8.7 10*3/uL (ref 4.0–10.5)

## 2017-11-21 LAB — BASIC METABOLIC PANEL
Anion gap: 12 (ref 5–15)
BUN: 9 mg/dL (ref 6–20)
CO2: 22 mmol/L (ref 22–32)
Calcium: 8.7 mg/dL — ABNORMAL LOW (ref 8.9–10.3)
Chloride: 104 mmol/L (ref 101–111)
Creatinine, Ser: 0.66 mg/dL (ref 0.44–1.00)
GFR calc Af Amer: >60 mL/min (ref >60)
GFR calc non Af Amer: >60 mL/min (ref >60)
Glucose, Bld: 88 mg/dL (ref 65–99)
Potassium: 3.2 mmol/L — ABNORMAL LOW (ref 3.5–5.1)
Sodium: 138 mmol/L (ref 135–145)

## 2017-11-21 LAB — URINALYSIS, ROUTINE W REFLEX MICROSCOPIC
Bacteria, UA: NONE SEEN
Bilirubin Urine: NEGATIVE
Glucose, UA: NEGATIVE mg/dL
Hgb urine dipstick: NEGATIVE
Ketones, ur: 80 mg/dL — AB
Nitrite: NEGATIVE
Protein, ur: 100 mg/dL — AB
Specific Gravity, Urine: 1.024 (ref 1.005–1.030)
pH: 8 (ref 5.0–8.0)

## 2017-11-21 LAB — AST: AST: 18 U/L (ref 15–41)

## 2017-11-21 LAB — GC/CHLAMYDIA PROBE AMP (~~LOC~~) NOT AT ARMC
Chlamydia: NEGATIVE
Neisseria Gonorrhea: NEGATIVE

## 2017-11-21 LAB — HCG, QUANTITATIVE, PREGNANCY: hCG, Beta Chain, Quant, S: 918 m[IU]/mL — ABNORMAL HIGH (ref <5)

## 2017-11-21 LAB — ALT: ALT: 13 U/L — ABNORMAL LOW (ref 14–54)

## 2017-11-21 MED ORDER — FAMOTIDINE IN NACL 20-0.9 MG/50ML-% IV SOLN
20.0000 mg | Freq: Once | INTRAVENOUS | Status: AC
  Administered 2017-11-21: 20 mg via INTRAVENOUS
  Filled 2017-11-21: qty 50

## 2017-11-21 MED ORDER — GI COCKTAIL ~~LOC~~
30.0000 mL | Freq: Once | ORAL | Status: AC
  Administered 2017-11-21: 30 mL via ORAL
  Filled 2017-11-21: qty 30

## 2017-11-21 MED ORDER — LACTATED RINGERS IV SOLN
Freq: Once | INTRAVENOUS | Status: AC
  Administered 2017-11-21: 20:00:00 via INTRAVENOUS

## 2017-11-21 MED ORDER — PANTOPRAZOLE SODIUM 40 MG IV SOLR
40.0000 mg | Freq: Once | INTRAVENOUS | Status: AC
  Administered 2017-11-21: 40 mg via INTRAVENOUS
  Filled 2017-11-21: qty 40

## 2017-11-21 MED ORDER — HYDROMORPHONE HCL 1 MG/ML IJ SOLN
1.0000 mg | Freq: Once | INTRAMUSCULAR | Status: AC
  Administered 2017-11-21: 1 mg via INTRAVENOUS
  Filled 2017-11-21: qty 1

## 2017-11-21 MED ORDER — ONDANSETRON HCL 4 MG/2ML IJ SOLN
4.0000 mg | Freq: Once | INTRAMUSCULAR | Status: AC
  Administered 2017-11-21: 4 mg via INTRAVENOUS
  Filled 2017-11-21: qty 2

## 2017-11-21 MED ORDER — M.V.I. ADULT IV INJ
Freq: Once | INTRAVENOUS | Status: AC
  Administered 2017-11-21: 21:00:00 via INTRAVENOUS
  Filled 2017-11-21: qty 10

## 2017-11-21 MED ORDER — PROMETHAZINE HCL 25 MG/ML IJ SOLN
25.0000 mg | Freq: Once | INTRAMUSCULAR | Status: AC
  Administered 2017-11-21: 25 mg via INTRAVENOUS
  Filled 2017-11-21: qty 1

## 2017-11-21 NOTE — MAU Note (Signed)
Pt presents to MAU with c/o of nausea and vomiting since last week. Pt noticed blood in vomit today. Pt has had vaginal bleeding since yesterday after being told she was having miscarriage.

## 2017-11-21 NOTE — MAU Provider Note (Signed)
Chief Complaint:  Vaginal Bleeding; Nausea; and Emesis   First Provider Initiated Contact with Patient 11/21/17 1948       HPI: Vicki Daniel is a 21 y.o. G2P0010 who presents to maternity admissions reporting nausea and vomiting.  States cannot keep anything down since Thursday.  Has epigastric discomfort and has vomited blood.  Had a confirmed SAB yesterday.   She reports vaginal bleeding, vaginal itching/burning, urinary symptoms, h/a, dizziness, n/v, or fever/chills.  Taking Flagyl for BV and Trichomonas.   Vaginal Bleeding  The patient's primary symptoms include vaginal bleeding and vaginal discharge. The patient's pertinent negatives include no genital itching, genital lesions or genital odor. This is a recurrent problem. The current episode started in the past 7 days. The problem occurs constantly. The problem has been unchanged. The pain is mild. Associated symptoms include abdominal pain, nausea and vomiting. Pertinent negatives include no chills, constipation, diarrhea, fever or headaches. The vaginal discharge was bloody. The vaginal bleeding is typical of menses. She has not been passing clots. She has not been passing tissue. Nothing aggravates the symptoms. She has tried nothing for the symptoms.  Emesis   This is a recurrent problem. The current episode started in the past 7 days. The problem has been unchanged. There has been no fever. Associated symptoms include abdominal pain. Pertinent negatives include no chills, diarrhea, fever or headaches. She has tried nothing for the symptoms.    RN Note: Pt presents to MAU with c/o of nausea and vomiting since last week. Pt noticed blood in vomit today. Pt has had vaginal bleeding since yesterday after being told she was having miscarriage.     Past Medical History: Past Medical History:  Diagnosis Date  . Asthma     Past obstetric history: OB History  Gravida Para Term Preterm AB Living  2 0 0 0 1 0  SAB TAB Ectopic Multiple  Live Births  1 0 0 0 0    # Outcome Date GA Lbr Len/2nd Weight Sex Delivery Anes PTL Lv  2 Current           1 SAB               Past Surgical History: Past Surgical History:  Procedure Laterality Date  . WISDOM TOOTH EXTRACTION      Family History: History reviewed. No pertinent family history.  Social History: Social History   Tobacco Use  . Smoking status: Former Smoker    Last attempt to quit: 04/22/2016    Years since quitting: 1.5  . Smokeless tobacco: Never Used  Substance Use Topics  . Alcohol use: Yes  . Drug use: Yes    Types: Marijuana    Allergies: No Known Allergies  Meds:  Medications Prior to Admission  Medication Sig Dispense Refill Last Dose  . metroNIDAZOLE (FLAGYL) 500 MG tablet Take 1 tablet (500 mg total) by mouth 2 (two) times daily for 7 days. 14 tablet 0     I have reviewed patient's Past Medical Hx, Surgical Hx, Family Hx, Social Hx, medications and allergies.  ROS:  Review of Systems  Constitutional: Negative for chills and fever.  Gastrointestinal: Positive for abdominal pain, nausea and vomiting. Negative for constipation and diarrhea.  Genitourinary: Positive for vaginal bleeding and vaginal discharge.  Neurological: Negative for headaches.   Other systems negative     Physical Exam   Patient Vitals for the past 24 hrs:  BP Temp Temp src Pulse Resp SpO2 Height Weight  11/22/17 0020 Marland Kitchen(!)  99/43 98.7 F (37.1 C) Oral (!) 53 16 100 % - -  11/21/17 1940 120/67 98.8 F (37.1 C) Oral 84 18 - 5\' 9"  (1.753 m) 173 lb (78.5 kg)   Constitutional: Well-developed female in no acute distress. But she appears ill/tired. Cardiovascular: normal rate and rhythm, no ectopy audible, S1 & S2 heard, no murmur Respiratory: normal effort, no distress. Lungs CTAB with no wheezes or crackles GI: Abd soft, mildly tender over uterus.  Nondistended.  No rebound, No guarding.  Bowel Sounds audible  MS: Extremities nontender, no edema, normal  ROM Neurologic: Alert and oriented x 4.   Grossly nonfocal. GU: Neg CVAT. Skin:  Warm and Dry Psych:  Affect appropriate.  PELVIC EXAM: deferred   Labs: Results for orders placed or performed during the hospital encounter of 11/21/17 (from the past 24 hour(s))  Urinalysis, Routine w reflex microscopic     Status: Abnormal   Collection Time: 11/21/17  7:32 PM  Result Value Ref Range   Color, Urine YELLOW YELLOW   APPearance HAZY (A) CLEAR   Specific Gravity, Urine 1.024 1.005 - 1.030   pH 8.0 5.0 - 8.0   Glucose, UA NEGATIVE NEGATIVE mg/dL   Hgb urine dipstick NEGATIVE NEGATIVE   Bilirubin Urine NEGATIVE NEGATIVE   Ketones, ur 80 (A) NEGATIVE mg/dL   Protein, ur 161100 (A) NEGATIVE mg/dL   Nitrite NEGATIVE NEGATIVE   Leukocytes, UA SMALL (A) NEGATIVE   RBC / HPF 0-5 0 - 5 RBC/hpf   WBC, UA 6-30 0 - 5 WBC/hpf   Bacteria, UA NONE SEEN NONE SEEN   Squamous Epithelial / LPF 0-5 (A) NONE SEEN   Mucus PRESENT   CBC     Status: None   Collection Time: 11/21/17  8:14 PM  Result Value Ref Range   WBC 8.7 4.0 - 10.5 K/uL   RBC 3.98 3.87 - 5.11 MIL/uL   Hemoglobin 12.3 12.0 - 15.0 g/dL   HCT 09.636.0 04.536.0 - 40.946.0 %   MCV 90.5 78.0 - 100.0 fL   MCH 30.9 26.0 - 34.0 pg   MCHC 34.2 30.0 - 36.0 g/dL   RDW 81.114.1 91.411.5 - 78.215.5 %   Platelets 219 150 - 400 K/uL  Basic metabolic panel     Status: Abnormal   Collection Time: 11/21/17  8:14 PM  Result Value Ref Range   Sodium 138 135 - 145 mmol/L   Potassium 3.2 (L) 3.5 - 5.1 mmol/L   Chloride 104 101 - 111 mmol/L   CO2 22 22 - 32 mmol/L   Glucose, Bld 88 65 - 99 mg/dL   BUN 9 6 - 20 mg/dL   Creatinine, Ser 9.560.66 0.44 - 1.00 mg/dL   Calcium 8.7 (L) 8.9 - 10.3 mg/dL   GFR calc non Af Amer >60 >60 mL/min   GFR calc Af Amer >60 >60 mL/min   Anion gap 12 5 - 15  hCG, quantitative, pregnancy     Status: Abnormal   Collection Time: 11/21/17  8:14 PM  Result Value Ref Range   hCG, Beta Chain, Quant, S 918 (H) <5 mIU/mL  AST     Status: None    Collection Time: 11/21/17  8:14 PM  Result Value Ref Range   AST 18 15 - 41 U/L  ALT     Status: Abnormal   Collection Time: 11/21/17  8:14 PM  Result Value Ref Range   ALT 13 (L) 14 - 54 U/L    Ref. Range 11/18/2017 16:09  HCG, Beta Chain, Quant, S Latest Ref Range: <5 mIU/mL 6,141 (H)   --/--/A NEG (12/08 1609)  Imaging:    MAU Course/MDM: I have ordered labs as follows:  Bmet (potassium is slightly low), CBC (WNL), UA, HCG (lower) Imaging ordered: none Results reviewed.    Treatments in MAU included IV fluids, Zofran, GI cocktail, Pepcid. .    Will given one bag of LR with Zofran and Pepcid, then one liter with MVI  Will discharge home when N/V improved and she is rehydrated  Assessment: S/P complete spontaneous abortion yesterday Nausea and vomiting Mild hypokalemia  Plan: Report given to oncoming provider   Wynelle Bourgeois CNM, MSN Certified Nurse-Midwife 11/21/2017 7:48 PM   Pt reports improvement after protonix, phenergan, & dilaudid Able to tolerate PO fluids Rx zofran & protonix D/c flagyl until symptoms improve  Advance diet as tolerated  Msg to [facility] for appropriate SAB f/u  Judeth Horn, NP

## 2017-11-22 DIAGNOSIS — O219 Vomiting of pregnancy, unspecified: Secondary | ICD-10-CM | POA: Diagnosis not present

## 2017-11-22 MED ORDER — PANTOPRAZOLE SODIUM 40 MG PO TBEC
40.0000 mg | DELAYED_RELEASE_TABLET | Freq: Every day | ORAL | 0 refills | Status: DC
Start: 2017-11-22 — End: 2017-11-28

## 2017-11-22 MED ORDER — ONDANSETRON 4 MG PO TBDP: 4.0000 mg | ORAL_TABLET | Freq: Three times a day (TID) | ORAL | 0 refills | Status: DC | PRN

## 2017-11-22 NOTE — Discharge Instructions (Signed)
Vomiting, Adult  Vomiting occurs when stomach contents are thrown up and out of the mouth. Many people notice nausea before vomiting. Vomiting can make you feel weak and dehydrated. Dehydration can make you tired and thirsty, cause you to have a dry mouth, and decrease how often you urinate. Older adults and people who have other diseases or a weak immune system are at higher risk for dehydration. It is important to treat vomiting as told by your health care provider.  Follow these instructions at home:  Follow your health care provider’s instructions about how to care for yourself at home.  Eating and drinking   Follow these recommendations as told by your health care provider:  · Take an oral rehydration solution (ORS). This is a drink that is sold at pharmacies and retail stores.  · Eat bland, easy-to-digest foods in small amounts as you are able. These foods include bananas, applesauce, rice, lean meats, toast, and crackers.  · Drink clear fluids in small amounts as you are able. Clear fluids include water, ice chips, low-calorie sports drinks, and fruit juice that has water added (diluted fruit juice).  · Avoid fluids that contain a lot of sugar or caffeine.  · Avoid alcohol and foods that are spicy or fatty.    General instructions     · Wash your hands frequently with soap and water. If soap and water are not available, use hand sanitizer. Make sure that everyone in your household washes their hands frequently.  · Take over-the-counter and prescription medicines only as told by your health care provider.  · Watch your condition for any changes.  · Keep all follow-up visits as told by your health care provider. This is important.  Contact a health care provider if:  · You have a fever.  · You are not able to keep fluids down.  · Your vomiting gets worse.  · You have new symptoms.  · You feel light-headed or dizzy.  · You have a headache.  · You have muscle cramps.  Get help right away if:  · You have pain  in your chest, neck, arm, or jaw.  · You feel extremely weak or you faint.  · You have persistent vomiting.  · You have vomit that is bright red or looks like black coffee grounds.  · You have stools that are bloody or black, or stools that look like tar.  · You have severe pain, cramping, or bloating in your abdomen.  · You have a severe headache, a stiff neck, or both.  · You have a rash.  · You have trouble breathing or you are breathing very quickly.  · Your heart is beating very quickly.  · Your skin feels cold and clammy.  · You feel confused.  · You have pain while urinating.  · You have signs of dehydration, such as:  ? Dark urine, or very little or no urine.  ? Cracked lips.  ? Dry mouth.  ? Sunken eyes.  ? Sleepiness.  ? Weakness.  These symptoms may represent a serious problem that is an emergency. Do not wait to see if the symptoms will go away. Get medical help right away. Call your local emergency services (911 in the [place] Do not drive yourself to the hospital.  This information is not intended to replace advice given to you by your health care provider. Make sure you discuss any questions you have with your health care provider.  Document Released: 12/25/2015 Document Revised:   05/05/2016 Document Reviewed: 08/04/2015  Elsevier Interactive Patient Education © 2017 [employer]

## 2017-11-23 ENCOUNTER — Emergency Department (HOSPITAL_COMMUNITY)
Admission: EM | Admit: 2017-11-23 | Discharge: 2017-11-23 | Disposition: A | Discharge status: Home / Self Care | Payer: Medicaid Other | Attending: Emergency Medicine | Admitting: Emergency Medicine

## 2017-11-23 ENCOUNTER — Emergency Department (HOSPITAL_COMMUNITY): Payer: Medicaid Other

## 2017-11-23 ENCOUNTER — Encounter (HOSPITAL_COMMUNITY): Payer: Self-pay | Admitting: Emergency Medicine

## 2017-11-23 ENCOUNTER — Other Ambulatory Visit: Payer: Self-pay

## 2017-11-23 DIAGNOSIS — Z87891 Personal history of nicotine dependence: Secondary | ICD-10-CM | POA: Insufficient documentation

## 2017-11-23 DIAGNOSIS — R197 Diarrhea, unspecified: Secondary | ICD-10-CM | POA: Diagnosis not present

## 2017-11-23 DIAGNOSIS — J45909 Unspecified asthma, uncomplicated: Secondary | ICD-10-CM | POA: Insufficient documentation

## 2017-11-23 DIAGNOSIS — R109 Unspecified abdominal pain: Secondary | ICD-10-CM | POA: Insufficient documentation

## 2017-11-23 DIAGNOSIS — R1013 Epigastric pain: Secondary | ICD-10-CM | POA: Diagnosis not present

## 2017-11-23 DIAGNOSIS — R112 Nausea with vomiting, unspecified: Secondary | ICD-10-CM | POA: Insufficient documentation

## 2017-11-23 DIAGNOSIS — O26891 Other specified pregnancy related conditions, first trimester: Secondary | ICD-10-CM | POA: Diagnosis not present

## 2017-11-23 DIAGNOSIS — O219 Vomiting of pregnancy, unspecified: Secondary | ICD-10-CM | POA: Diagnosis not present

## 2017-11-23 LAB — COMPREHENSIVE METABOLIC PANEL
ALT: 14 U/L (ref 14–54)
AST: 21 U/L (ref 15–41)
Albumin: 4 g/dL (ref 3.5–5.0)
Alkaline Phosphatase: 39 U/L (ref 38–126)
Anion gap: 11 (ref 5–15)
BUN: 11 mg/dL (ref 6–20)
CO2: 24 mmol/L (ref 22–32)
Calcium: 8.6 mg/dL — ABNORMAL LOW (ref 8.9–10.3)
Chloride: 102 mmol/L (ref 101–111)
Creatinine, Ser: 0.65 mg/dL (ref 0.44–1.00)
GFR calc Af Amer: >60 mL/min (ref >60)
GFR calc non Af Amer: >60 mL/min (ref >60)
Glucose, Bld: 95 mg/dL (ref 65–99)
Potassium: 2.8 mmol/L — ABNORMAL LOW (ref 3.5–5.1)
Sodium: 137 mmol/L (ref 135–145)
Total Bilirubin: 0.9 mg/dL (ref 0.3–1.2)
Total Protein: 7.1 g/dL (ref 6.5–8.1)

## 2017-11-23 LAB — CBC
HCT: 36.4 % (ref 36.0–46.0)
Hemoglobin: 12.5 g/dL (ref 12.0–15.0)
MCH: 30.5 pg (ref 26.0–34.0)
MCHC: 34.3 g/dL (ref 30.0–36.0)
MCV: 88.8 fL (ref 78.0–100.0)
Platelets: 216 10*3/uL (ref 150–400)
RBC: 4.1 MIL/uL (ref 3.87–5.11)
RDW: 14 % (ref 11.5–15.5)
WBC: 9.4 10*3/uL (ref 4.0–10.5)

## 2017-11-23 LAB — URINALYSIS, ROUTINE W REFLEX MICROSCOPIC
Glucose, UA: NEGATIVE mg/dL
Ketones, ur: 80 mg/dL — AB
Nitrite: NEGATIVE
Protein, ur: 100 mg/dL — AB
Specific Gravity, Urine: 1.029 (ref 1.005–1.030)
pH: 6 (ref 5.0–8.0)

## 2017-11-23 LAB — LIPASE, BLOOD: Lipase: 19 U/L (ref 11–51)

## 2017-11-23 LAB — I-STAT BETA HCG BLOOD, ED (MC, WL, AP ONLY): I-stat hCG, quantitative: 504.6 m[IU]/mL — ABNORMAL HIGH (ref <5)

## 2017-11-23 MED ORDER — PROMETHAZINE HCL 25 MG RE SUPP: 25.0000 mg | Freq: Four times a day (QID) | RECTAL | 0 refills | Status: DC | PRN

## 2017-11-23 MED ORDER — IOPAMIDOL (ISOVUE-300) INJECTION 61%
INTRAVENOUS | Status: AC
  Administered 2017-11-23: 100 mL via INTRAVENOUS
  Filled 2017-11-23: qty 100

## 2017-11-23 MED ORDER — MORPHINE SULFATE (PF) 4 MG/ML IV SOLN
4.0000 mg | Freq: Once | INTRAVENOUS | Status: AC
  Administered 2017-11-23: 4 mg via INTRAVENOUS
  Filled 2017-11-23: qty 1

## 2017-11-23 MED ORDER — GI COCKTAIL ~~LOC~~
30.0000 mL | Freq: Once | ORAL | Status: AC
  Administered 2017-11-23: 30 mL via ORAL
  Filled 2017-11-23: qty 30

## 2017-11-23 MED ORDER — HYDROMORPHONE HCL 1 MG/ML IJ SOLN
0.2500 mg | Freq: Once | INTRAMUSCULAR | Status: AC
  Administered 2017-11-23: 0.25 mg via INTRAVENOUS
  Filled 2017-11-23: qty 1

## 2017-11-23 MED ORDER — MAGNESIUM OXIDE 400 (241.3 MG) MG PO TABS
800.0000 mg | ORAL_TABLET | Freq: Once | ORAL | Status: AC
  Administered 2017-11-23: 800 mg via ORAL
  Filled 2017-11-23: qty 2

## 2017-11-23 MED ORDER — IOPAMIDOL (ISOVUE-300) INJECTION 61%
100.0000 mL | Freq: Once | INTRAVENOUS | Status: AC | PRN
  Administered 2017-11-23: 100 mL via INTRAVENOUS

## 2017-11-23 MED ORDER — POTASSIUM CHLORIDE CRYS ER 20 MEQ PO TBCR
40.0000 meq | EXTENDED_RELEASE_TABLET | Freq: Once | ORAL | Status: AC
  Administered 2017-11-23: 40 meq via ORAL
  Filled 2017-11-23: qty 2

## 2017-11-23 MED ORDER — ONDANSETRON HCL 4 MG/2ML IJ SOLN
4.0000 mg | Freq: Once | INTRAMUSCULAR | Status: AC
  Administered 2017-11-23: 4 mg via INTRAVENOUS
  Filled 2017-11-23: qty 2

## 2017-11-23 MED ORDER — SODIUM CHLORIDE 0.9 % IV BOLUS (SEPSIS)
1000.0000 mL | Freq: Once | INTRAVENOUS | Status: AC
  Administered 2017-11-23: 1000 mL via INTRAVENOUS

## 2017-11-23 NOTE — ED Provider Notes (Signed)
[facility]-EMERGENCY DEPT Provider Note   CSN: [id number] Arrival date & time: 11/23/17  1324     History   Chief Complaint Chief Complaint  Patient presents with  . Abdominal Pain  . Emesis    HPI Vicki Daniel is a 21 y.o. female.  21 yo F with a chief complaint of diffuse abdominal pain and vomiting.  This been going on for the past week or so.  Initially the patient was diagnosed as being pregnant.  Has been to the MAU couple times in the past week.  Has had 2 nondiagnostic ultrasounds.  Patient was discharged with some nausea medicines but continues to have persistent nausea and vomiting.  She says she has been throwing up blood and describes it to be is brownish material intermixed with what she ate and drank.  The pain to her abdomen is diffuse and crampy.  Seems to come and go.  Denies fevers.  Denies diarrhea.  Continue to have vaginal bleeding but somewhat slowed from prior.   The history is provided by the patient.  Abdominal Pain   This is a new problem. The current episode started more than 2 days ago. The problem occurs constantly. The problem has been gradually worsening. The pain is associated with eating. The pain is located in the generalized abdominal region. The quality of the pain is cramping. The pain is at a severity of 9/10. The pain is moderate. Associated symptoms include nausea and vomiting. Pertinent negatives include fever, dysuria, headaches, arthralgias and myalgias. Nothing aggravates the symptoms. Nothing relieves the symptoms.  Emesis   Associated symptoms include abdominal pain. Pertinent negatives include no arthralgias, no chills, no fever, no headaches and no myalgias.    Past Medical History:  Diagnosis Date  . Asthma     There are no active problems to display for this patient.   Past Surgical History:  Procedure Laterality Date  . WISDOM TOOTH EXTRACTION      OB History    Gravida Para Term Preterm AB Living     2 0 0 0 1 0   SAB TAB Ectopic Multiple Live Births   1 0 0 0 0       Home Medications    Prior to Admission medications   Medication Sig Start Date End Date Taking? Authorizing Provider  metroNIDAZOLE (FLAGYL) 500 MG tablet Take 1 tablet (500 mg total) by mouth 2 (two) times daily for 7 days. Patient not taking: Reported on 11/23/2017 11/18/17 11/25/17  Pincus Large, DO  ondansetron (ZOFRAN ODT) 4 MG disintegrating tablet Take 1 tablet (4 mg total) by mouth every 8 (eight) hours as needed for nausea or vomiting. Patient not taking: Reported on 11/23/2017 11/22/17   Judeth Horn, NP  pantoprazole (PROTONIX) 40 MG tablet Take 1 tablet (40 mg total) by mouth daily. Patient not taking: Reported on 11/23/2017 11/22/17   Judeth Horn, NP  promethazine (PHENERGAN) 25 MG suppository Place 1 suppository (25 mg total) rectally every 6 (six) hours as needed for nausea or vomiting. 11/23/17   Melene Plan, DO    Family History No family history on file.  Social History Social History   Tobacco Use  . Smoking status: Former Smoker    Last attempt to quit: 04/22/2016    Years since quitting: 1.5  . Smokeless tobacco: Never Used  Substance Use Topics  . Alcohol use: Yes  . Drug use: Yes    Types: Marijuana     Allergies  Patient has no known allergies.   Review of Systems Review of Systems  Constitutional: Negative for chills and fever.  HENT: Negative for congestion and rhinorrhea.   Eyes: Negative for redness and visual disturbance.  Respiratory: Negative for shortness of breath and wheezing.   Cardiovascular: Negative for chest pain and palpitations.  Gastrointestinal: Positive for abdominal pain, nausea and vomiting.  Genitourinary: Negative for dysuria and urgency.  Musculoskeletal: Negative for arthralgias and myalgias.  Skin: Negative for pallor and wound.  Neurological: Negative for dizziness and headaches.     Physical Exam Updated Vital Signs BP (!)  112/100   Pulse 90   Temp 98.2 F (36.8 C) (Oral)   Resp 15   Ht 5\' 9"  (1.753 m)   Wt 77.1 kg (170 lb)   LMP 09/16/2017   SpO2 100%   Breastfeeding? Unknown   BMI 25.10 kg/m   Physical Exam  Constitutional: She is oriented to person, place, and time. She appears well-developed and well-nourished. No distress.  HENT:  Head: Normocephalic and atraumatic.  Eyes: EOM are normal. Pupils are equal, round, and reactive to light.  Neck: Normal range of motion. Neck supple.  Cardiovascular: Normal rate and regular rhythm. Exam reveals no gallop and no friction rub.  No murmur heard. Pulmonary/Chest: Effort normal. She has no wheezes. She has no rales.  Abdominal: Soft. She exhibits no distension. There is no tenderness.  Musculoskeletal: She exhibits no edema or tenderness.  Neurological: She is alert and oriented to person, place, and time.  Skin: Skin is warm and dry. She is not diaphoretic.  Psychiatric: She has a normal mood and affect. Her behavior is normal.  Nursing note and vitals reviewed.    ED Treatments / Results  Labs (all labs ordered are listed, but only abnormal results are displayed) Labs Reviewed  COMPREHENSIVE METABOLIC PANEL - Abnormal; Notable for the following components:      Result Value   Potassium 2.8 (*)    Calcium 8.6 (*)    All other components within normal limits  URINALYSIS, ROUTINE W REFLEX MICROSCOPIC - Abnormal; Notable for the following components:   Color, Urine AMBER (*)    APPearance HAZY (*)    Hgb urine dipstick MODERATE (*)    Bilirubin Urine SMALL (*)    Ketones, ur 80 (*)    Protein, ur 100 (*)    Leukocytes, UA MODERATE (*)    Bacteria, UA FEW (*)    Squamous Epithelial / LPF 6-30 (*)    All other components within normal limits  I-STAT BETA HCG BLOOD, ED ([facility], [facility], [facility] ONLY) - Abnormal; Notable for the following components:   I-stat hCG, quantitative 504.6 (*)    All other components within normal limits  LIPASE, BLOOD  CBC     EKG  EKG Interpretation None       Radiology Ct Abdomen Pelvis W Contrast  Result Date: 11/23/2017 CLINICAL DATA:  Abdominal pain and vomiting EXAM: CT ABDOMEN AND PELVIS WITH CONTRAST TECHNIQUE: Multidetector CT imaging of the abdomen and pelvis was performed using the standard protocol following bolus administration of intravenous contrast. By report, patient has decreasing beta HCG value, suspicious for recent spontaneous abortion. Patient did sign pregnancy consent form after discussion with the performing technologist and the emergency department physician. CONTRAST:  100 mL Isovue 370 nonionic COMPARISON:  None. FINDINGS: Lower chest: Lung bases are clear. Hepatobiliary: No focal liver lesions are appreciable. Gallbladder wall is not appreciably thickened. There is no biliary duct  dilatation. Pancreas: No pancreatic mass or inflammatory focus. Spleen: No splenic lesions are evident. Adrenals/Urinary Tract: Adrenals appear normal bilaterally. Kidneys bilaterally show no evident mass or hydronephrosis on either side. There is no renal or ureteral calculus on either side. Urinary bladder is midline with wall thickness within normal limits. Stomach/Bowel: There is no bowel wall or mesenteric thickening. No evident bowel obstruction. No free air or portal venous air. Vascular/Lymphatic: There is no abdominal aortic aneurysm. No vascular lesions are evident. No adenopathy is appreciable in the abdomen or pelvis. Reproductive: Uterus is anteverted. There is no pelvic mass. There is a small amount of free fluid in the cul-de-sac. Other: Appendix appears normal. No abscess is seen in the an or pelvis. There is no ascites beyond small amount of fluid in the cul de sac. Musculoskeletal: There are no blastic or lytic bone lesions. There is no intramuscular or abdominal wall lesion. IMPRESSION: 1. Small amount of free fluid in cul-de-sac. Question upper normal amount of physiologic fluid versus recent  ovarian cyst rupture. 2.  No pelvic mass evident.  Appendix appears normal. 3.  No bowel obstruction.  No abscess. 4.  No renal or ureteral calculus.  No hydronephrosis. Electronically Signed   By: Bretta BangWilliam  Woodruff III M.D.   On: 11/23/2017 10:19    Procedures Procedures (including critical care time)  Medications Ordered in ED Medications  HYDROmorphone (DILAUDID) injection 0.25 mg (not administered)  sodium chloride 0.9 % bolus 1,000 mL (1,000 mLs Intravenous New Bag/Given 11/23/17 0823)  ondansetron (ZOFRAN) injection 4 mg (4 mg Intravenous Given 11/23/17 0823)  morphine 4 MG/ML injection 4 mg (4 mg Intravenous Given 11/23/17 0830)  gi cocktail (Maalox,Lidocaine,Donnatal) (30 mLs Oral Given 11/23/17 0908)  potassium chloride SA (K-DUR,KLOR-CON) CR tablet 40 mEq (40 mEq Oral Given 11/23/17 1021)  magnesium oxide (MAG-OX) tablet 800 mg (800 mg Oral Given 11/23/17 1020)  iopamidol (ISOVUE-300) 61 % injection 100 mL (100 mLs Intravenous Contrast Given 11/23/17 0947)     Initial Impression / Assessment and Plan / ED Course  I have reviewed the triage vital signs and the nursing notes.  Pertinent labs & imaging results that were available during my care of the patient were reviewed by me and considered in my medical decision making (see chart for details).     21 yo F with a cc diffuse abdominal pain.  This been going on for the better course of a week.  The patient has been vomiting blood as well.  She says that it is brown and intermixed with whenever she drinks.  Diffuse crampy abdominal pain.  Recently was diagnosed as being pregnant but had no confirmatory imaging study.  Has had 2 ultrasounds that have been nondiagnostic.  With the patient's continued ongoing symptoms I am concerned that the patient may have some underlying bowel pathology.  I discussed the case with Dr. Ashley MurrainPoff, radiology he felt that in this case as the patient still had a positive hCG but rapidly downtrending was  unlikely to be pregnant and felt that a CT scan may be warranted.  CT scan is negative for acute pathology.  Patient is feeling better with symptomatic therapy.  As the patient had trouble with oral medicines at home we will sent home with Phenergan suppositories.  She is concerned that she may have stomach cancer.  I discussed that this is usually an outpatient diagnosis.  We will give her GI follow-up as needed.  10:55 AM:  I have discussed the diagnosis/risks/treatment options with  the patient and believe the pt to be eligible for discharge home to follow-up with PCP, GYN, GI. We also discussed returning to the ED immediately if new or worsening sx occur. We discussed the sx which are most concerning (e.g., sudden worsening pain, fever, inability to tolerate by mouth) that necessitate immediate return. Medications administered to the patient during their visit and any new prescriptions provided to the patient are listed below.  Medications given during this visit Medications  HYDROmorphone (DILAUDID) injection 0.25 mg (not administered)  sodium chloride 0.9 % bolus 1,000 mL (1,000 mLs Intravenous New Bag/Given 11/23/17 0823)  ondansetron (ZOFRAN) injection 4 mg (4 mg Intravenous Given 11/23/17 0823)  morphine 4 MG/ML injection 4 mg (4 mg Intravenous Given 11/23/17 0830)  gi cocktail (Maalox,Lidocaine,Donnatal) (30 mLs Oral Given 11/23/17 0908)  potassium chloride SA (K-DUR,KLOR-CON) CR tablet 40 mEq (40 mEq Oral Given 11/23/17 1021)  magnesium oxide (MAG-OX) tablet 800 mg (800 mg Oral Given 11/23/17 1020)  iopamidol (ISOVUE-300) 61 % injection 100 mL (100 mLs Intravenous Contrast Given 11/23/17 0947)     The patient appears reasonably screen and/or stabilized for discharge and I doubt any other medical condition or other [facility] requiring further screening, evaluation, or treatment in the ED at this time prior to discharge.    Final Clinical Impressions(s) / ED Diagnoses   Final diagnoses:   Nausea vomiting and diarrhea    ED Discharge Orders        Ordered    promethazine (PHENERGAN) 25 MG suppository  Every 6 hours PRN     11/23/17 1040       Melene PlanFloyd, Anastacio Bua, DO 11/23/17 1055

## 2017-11-23 NOTE — ED Triage Notes (Addendum)
Patient c/o abd pain with vomiting since last Thursday. Reports that now vomiting blood and not urinating or "poo pooing because not eating or drinking".  Patient adds that she had miscarriage since last week.

## 2017-11-23 NOTE — ED Notes (Signed)
Patient transported to CT 

## 2017-11-23 NOTE — ED Triage Notes (Signed)
Pt states she is been seen on different ER for the past 4 days with no solution to her problem. Pt states she took an abortion pill few days ago and since then she is having 10/10 abd pain and is been vomiting blood. Denies fever or chills.

## 2017-11-24 ENCOUNTER — Other Ambulatory Visit: Payer: Self-pay

## 2017-11-24 ENCOUNTER — Encounter (HOSPITAL_COMMUNITY): Payer: Self-pay | Admitting: Emergency Medicine

## 2017-11-24 ENCOUNTER — Inpatient Hospital Stay (HOSPITAL_COMMUNITY)
Admission: AD | Admit: 2017-11-24 | Discharge: 2017-11-24 | Disposition: A | Discharge status: Home / Self Care | Payer: Medicaid Other | Source: Ambulatory Visit | Attending: Obstetrics and Gynecology | Admitting: Obstetrics and Gynecology

## 2017-11-24 ENCOUNTER — Emergency Department (HOSPITAL_COMMUNITY)
Admission: EM | Admit: 2017-11-24 | Discharge: 2017-11-24 | Discharge status: Against Medical Advice | Payer: Medicaid Other | Source: Home / Self Care

## 2017-11-24 DIAGNOSIS — Z87891 Personal history of nicotine dependence: Secondary | ICD-10-CM | POA: Insufficient documentation

## 2017-11-24 DIAGNOSIS — R1013 Epigastric pain: Secondary | ICD-10-CM

## 2017-11-24 DIAGNOSIS — O26891 Other specified pregnancy related conditions, first trimester: Secondary | ICD-10-CM | POA: Insufficient documentation

## 2017-11-24 DIAGNOSIS — R112 Nausea with vomiting, unspecified: Secondary | ICD-10-CM | POA: Insufficient documentation

## 2017-11-24 DIAGNOSIS — Z3A09 9 weeks gestation of pregnancy: Secondary | ICD-10-CM | POA: Diagnosis not present

## 2017-11-24 DIAGNOSIS — R109 Unspecified abdominal pain: Secondary | ICD-10-CM | POA: Diagnosis present

## 2017-11-24 LAB — COMPREHENSIVE METABOLIC PANEL
ALT: 12 U/L — ABNORMAL LOW (ref 14–54)
AST: 18 U/L (ref 15–41)
Albumin: 3.7 g/dL (ref 3.5–5.0)
Alkaline Phosphatase: 36 U/L — ABNORMAL LOW (ref 38–126)
Anion gap: 11 (ref 5–15)
BUN: 5 mg/dL — ABNORMAL LOW (ref 6–20)
CO2: 21 mmol/L — ABNORMAL LOW (ref 22–32)
Calcium: 8.4 mg/dL — ABNORMAL LOW (ref 8.9–10.3)
Chloride: 102 mmol/L (ref 101–111)
Creatinine, Ser: 0.77 mg/dL (ref 0.44–1.00)
GFR calc Af Amer: >60 mL/min (ref >60)
GFR calc non Af Amer: >60 mL/min (ref >60)
Glucose, Bld: 86 mg/dL (ref 65–99)
Potassium: 3 mmol/L — ABNORMAL LOW (ref 3.5–5.1)
Sodium: 134 mmol/L — ABNORMAL LOW (ref 135–145)
Total Bilirubin: 0.7 mg/dL (ref 0.3–1.2)
Total Protein: 6.4 g/dL — ABNORMAL LOW (ref 6.5–8.1)

## 2017-11-24 LAB — URINALYSIS, ROUTINE W REFLEX MICROSCOPIC
Bilirubin Urine: NEGATIVE
Glucose, UA: NEGATIVE mg/dL
Ketones, ur: 80 mg/dL — AB
Nitrite: NEGATIVE
Protein, ur: 30 mg/dL — AB
Specific Gravity, Urine: 1.023 (ref 1.005–1.030)
pH: 8 (ref 5.0–8.0)

## 2017-11-24 LAB — CBC
HCT: 36.7 % (ref 36.0–46.0)
Hemoglobin: 12.4 g/dL (ref 12.0–15.0)
MCH: 29.8 pg (ref 26.0–34.0)
MCHC: 33.8 g/dL (ref 30.0–36.0)
MCV: 88.2 fL (ref 78.0–100.0)
Platelets: 254 10*3/uL (ref 150–400)
RBC: 4.16 MIL/uL (ref 3.87–5.11)
RDW: 14.1 % (ref 11.5–15.5)
WBC: 9.2 10*3/uL (ref 4.0–10.5)

## 2017-11-24 LAB — I-STAT BETA HCG BLOOD, ED (MC, WL, AP ONLY): I-stat hCG, quantitative: 364.7 m[IU]/mL — ABNORMAL HIGH (ref <5)

## 2017-11-24 LAB — LIPASE, BLOOD: Lipase: 20 U/L (ref 11–51)

## 2017-11-24 MED ORDER — FAMOTIDINE IN NACL 20-0.9 MG/50ML-% IV SOLN
20.0000 mg | Freq: Once | INTRAVENOUS | Status: AC
  Administered 2017-11-24: 20 mg via INTRAVENOUS
  Filled 2017-11-24: qty 50

## 2017-11-24 MED ORDER — GI COCKTAIL ~~LOC~~
30.0000 mL | Freq: Once | ORAL | Status: DC
  Filled 2017-11-24: qty 30

## 2017-11-24 MED ORDER — KETOROLAC TROMETHAMINE 30 MG/ML IJ SOLN
30.0000 mg | Freq: Once | INTRAMUSCULAR | Status: AC
  Administered 2017-11-24: 30 mg via INTRAVENOUS
  Filled 2017-11-24: qty 1

## 2017-11-24 MED ORDER — SODIUM CHLORIDE 0.9 % IV SOLN
25.0000 mg | Freq: Once | INTRAVENOUS | Status: AC
  Administered 2017-11-24: 25 mg via INTRAVENOUS
  Filled 2017-11-24: qty 1

## 2017-11-24 NOTE — Discharge Instructions (Signed)
GI referral sent Take medications to help prevent ulcers/reflux  Gastroesophageal Reflux Disease, Adult Normally, food travels down the esophagus and stays in the stomach to be digested. If a person has gastroesophageal reflux disease (GERD), food and stomach acid move back up into the esophagus. When this happens, the esophagus becomes sore and swollen (inflamed). Over time, GERD can make small holes (ulcers) in the lining of the esophagus. Follow these instructions at home: Diet  Follow a diet as told by your doctor. You may need to avoid foods and drinks such as: ? Coffee and tea (with or without caffeine). ? Drinks that contain alcohol. ? Energy drinks and sports drinks. ? Carbonated drinks or sodas. ? Chocolate and cocoa. ? Peppermint and mint flavorings. ? Garlic and onions. ? Horseradish. ? Spicy and acidic foods, such as peppers, chili powder, curry powder, vinegar, hot sauces, and BBQ sauce. ? Citrus fruit juices and citrus fruits, such as oranges, lemons, and limes. ? Tomato-based foods, such as red sauce, chili, salsa, and pizza with red sauce. ? Fried and fatty foods, such as donuts, french fries, potato chips, and high-fat dressings. ? High-fat meats, such as hot dogs, rib eye steak, sausage, ham, and bacon. ? High-fat dairy items, such as whole milk, butter, and cream cheese.  Eat small meals often. Avoid eating large meals.  Avoid drinking large amounts of liquid with your meals.  Avoid eating meals during the 2-3 hours before bedtime.  Avoid lying down right after you eat.  Do not exercise right after you eat. General instructions  Pay attention to any changes in your symptoms.  Take over-the-counter and prescription medicines only as told by your doctor. Do not take aspirin, ibuprofen, or other NSAIDs unless your doctor says it is okay.  Do not use any tobacco products, including cigarettes, chewing tobacco, and e-cigarettes. If you need help quitting, ask your  doctor.  Wear loose clothes. Do not wear anything tight around your waist.  Raise (elevate) the head of your bed about 6 inches (15 cm).  Try to lower your stress. If you need help doing this, ask your doctor.  If you are overweight, lose an amount of weight that is healthy for you. Ask your doctor about a safe weight loss goal.  Keep all follow-up visits as told by your doctor. This is important. Contact a doctor if:  You have new symptoms.  You lose weight and you do not know why it is happening.  You have trouble swallowing, or it hurts to swallow.  You have wheezing or a cough that keeps happening.  Your symptoms do not get better with treatment.  You have a hoarse voice. Get help right away if:  You have pain in your arms, neck, jaw, teeth, or back.  You feel sweaty, dizzy, or light-headed.  You have chest pain or shortness of breath.  You throw up (vomit) and your throw up looks like blood or coffee grounds.  You pass out (faint).  Your poop (stool) is bloody or black.  You cannot swallow, drink, or eat. This information is not intended to replace advice given to you by your health care provider. Make sure you discuss any questions you have with your health care provider. Document Released: 05/16/2008 Document Revised: 05/05/2016 Document Reviewed: 03/25/2015 Elsevier Interactive Patient Education  [employer]2018 [employer]

## 2017-11-24 NOTE — MAU Provider Note (Signed)
Chief Complaint: Abdominal Pain and Nausea   SUBJECTIVE HPI: Vicki Daniel is a 21 y.o. G2P0010 at [id number] who presents to MAU via EMS. Patient states that she is having abdominal pain. Localizes pain mainly to the upper abdomen/epigastiurm. Pain is decrsibed as sharp and achy. It is a constant pain that she rates 8/10. Patient also endosrses poor appetitie for motnhs. Havine recurrent emesis. Reports x10 in last 24/hrs. For abotu a week. None of her antiemetics or pain emdicines work. Endorses hematemsis. Currently denies fever, diarrhea, chills.  [facility] and emesis which is her chief complaint here.  CT was perfromed with was negative for acute pathology. She was discharged with instructions to follow-up with GI. Patient states she came here because she wanted more done. She has been seen several times for this with no answer to why she contineus to have paina nd emesis.  Also of note patient with recent miscarriage.   Past Medical History:  Diagnosis Date  . Asthma    OB History  Gravida Para Term Preterm AB Living  2 0 0 0 1 0  SAB TAB Ectopic Multiple Live Births  1 0 0 0 0    # Outcome Date GA Lbr Len/2nd Weight Sex Delivery Anes PTL Lv  2 Gravida           1 SAB 2017             Past Surgical History:  Procedure Laterality Date  . WISDOM TOOTH EXTRACTION     Social History   Socioeconomic History  . Marital status: Single    Spouse name: Not on file  . Number of children: Not on file  . Years of education: Not on file  . Highest education level: Not on file  Social Needs  . Financial resource strain: Not on file  . Food insecurity - worry: Not on file  . Food insecurity - inability: Not on file  . Transportation needs - medical: Not on file  . Transportation needs - non-medical: Not on file  Occupational History  . Not on file  Tobacco Use  . Smoking status: Former Smoker    Last attempt to quit: 04/22/2016    Years since  quitting: 1.5  . Smokeless tobacco: Never Used  Substance and Sexual Activity  . Alcohol use: Yes  . Drug use: Yes    Types: Marijuana  . Sexual activity: Yes    Birth control/protection: None  Other Topics Concern  . Not on file  Social History Narrative  . Not on file   No current facility-administered medications on file prior to encounter.    Current Outpatient Medications on File Prior to Encounter  Medication Sig Dispense Refill  . metroNIDAZOLE (FLAGYL) 500 MG tablet Take 1 tablet (500 mg total) by mouth 2 (two) times daily for 7 days. (Patient not taking: Reported on 11/23/2017) 14 tablet 0  . ondansetron (ZOFRAN ODT) 4 MG disintegrating tablet Take 1 tablet (4 mg total) by mouth every 8 (eight) hours as needed for nausea or vomiting. (Patient not taking: Reported on 11/23/2017) 15 tablet 0  . pantoprazole (PROTONIX) 40 MG tablet Take 1 tablet (40 mg total) by mouth daily. (Patient not taking: Reported on 11/23/2017) 30 tablet 0   No Known Allergies  I have reviewed the past Medical Hx, Surgical Hx, Social Hx, Allergies and Medications.   REVIEW OF SYSTEMS All systems reviewed and are negative for acute change except as  noted in the HPI.   OBJECTIVE BP 126/75 (BP Location: Right Arm)   Pulse 80   Temp 98 F (36.7 C) (Oral)   Resp 17   Ht 5\' 9"  (1.753 m)   Wt 76.7 kg (169 lb)   LMP 09/16/2017   SpO2 100%   BMI 24.96 kg/m    PHYSICAL EXAM Constitutional: Well-developed, well-nourished female in acute distress.  Throat: dry mucous membranes, no blood appreciated Cardiovascular: normal rate and rhythm, pulses intact Respiratory: normal rate and effort.  GI: Abd soft, epigastric tenderness with voluntary guarding with palpation, non-distended. Pos BS x 4 MS: Extremities nontender, no edema, normal ROM Neurologic: Alert and oriented x 4. No focal deficits Psych: Anxious, tearful  LAB RESULTS Results for orders placed or performed during the hospital encounter  of 11/24/17 (from the past 24 hour(s))  Urinalysis, Routine w reflex microscopic     Status: Abnormal   Collection Time: 11/24/17 12:07 AM  Result Value Ref Range   Color, Urine YELLOW YELLOW   APPearance CLOUDY (A) CLEAR   Specific Gravity, Urine 1.023 1.005 - 1.030   pH 8.0 5.0 - 8.0   Glucose, UA NEGATIVE NEGATIVE mg/dL   Hgb urine dipstick LARGE (A) NEGATIVE   Bilirubin Urine NEGATIVE NEGATIVE   Ketones, ur 80 (A) NEGATIVE mg/dL   Protein, ur 30 (A) NEGATIVE mg/dL   Nitrite NEGATIVE NEGATIVE   Leukocytes, UA TRACE (A) NEGATIVE   RBC / HPF 0-5 0 - 5 RBC/hpf   WBC, UA 6-30 0 - 5 WBC/hpf   Bacteria, UA RARE (A) NONE SEEN   Squamous Epithelial / LPF 0-5 (A) NONE SEEN   Mucus PRESENT    Amorphous Crystal PRESENT   Lipase, blood     Status: None   Collection Time: 11/24/17 12:15 AM  Result Value Ref Range   Lipase 20 11 - 51 U/L  Comprehensive metabolic panel     Status: Abnormal   Collection Time: 11/24/17 12:15 AM  Result Value Ref Range   Sodium 134 (L) 135 - 145 mmol/L   Potassium 3.0 (L) 3.5 - 5.1 mmol/L   Chloride 102 101 - 111 mmol/L   CO2 21 (L) 22 - 32 mmol/L   Glucose, Bld 86 65 - 99 mg/dL   BUN 5 (L) 6 - 20 mg/dL   Creatinine, Ser 4.09 0.44 - 1.00 mg/dL   Calcium 8.4 (L) 8.9 - 10.3 mg/dL   Total Protein 6.4 (L) 6.5 - 8.1 g/dL   Albumin 3.7 3.5 - 5.0 g/dL   AST 18 15 - 41 U/L   ALT 12 (L) 14 - 54 U/L   Alkaline Phosphatase 36 (L) 38 - 126 U/L   Total Bilirubin 0.7 0.3 - 1.2 mg/dL   GFR calc non Af Amer >60 >60 mL/min   GFR calc Af Amer >60 >60 mL/min   Anion gap 11 5 - 15  CBC     Status: None   Collection Time: 11/24/17 12:15 AM  Result Value Ref Range   WBC 9.2 4.0 - 10.5 K/uL   RBC 4.16 3.87 - 5.11 MIL/uL   Hemoglobin 12.4 12.0 - 15.0 g/dL   HCT 81.1 91.4 - 78.2 %   MCV 88.2 78.0 - 100.0 fL   MCH 29.8 26.0 - 34.0 pg   MCHC 33.8 30.0 - 36.0 g/dL   RDW 95.6 21.3 - 08.6 %   Platelets 254 150 - 400 K/uL  I-Stat beta hCG blood, ED     Status: Abnormal  Collection Time: 11/24/17 12:38 AM  Result Value Ref Range   I-stat hCG, quantitative 364.7 (H) <5 mIU/mL   Comment 3            IMAGING [place] Ob Comp Less 14 Wks  Result Date: 11/18/2017 CLINICAL DATA:  Pelvic pain and vaginal bleeding EXAM: OBSTETRIC <14 WK US AND TRANSVAGINAL OB US TECHNIQUE: Both transabdominal and transvaginal ultrasound examinations were performed for complete evaluation of the gestation as well as the maternal uterus, adnexal regions, and pelvic cul-de-sac. Transvaginal technique was performed to assess early pregnancy. COMPARISON:  None. FINDINGS: Intrauterine gestational sac: Not visualized Yolk sac:  Not visualized Embryo:  Not visualized Cardiac Activity: Not visualized Subchorionic hemorrhage:  None visualized. Maternal uterus/adnexae: Uterus measures 8.2 x 4.8 x 4.8 cm. There is no intrauterine mass. Endometrium measures 12 mm with smooth contour. Left ovary measures 2.2 x 1.7 x 1.8 cm. Right ovary measures 3.8 x 1.8 x 2.5 cm. There is no extrauterine pelvic or adnexal mass. No free pelvic fluid. IMPRESSION: No intrauterine gestation is apparent. Assuming positive pregnancy test, differential considerations must include intrauterine gestation too early to be seen by either transabdominal or transvaginal technique; recent spontaneous abortion; ectopic gestation. In this regard, close clinical and laboratory surveillance advised. Timing of repeat imaging will depend largely on beta HCG values going forward. No intrauterine or extrauterine pelvic or adnexal mass. No free pelvic fluid. Endometrium not appreciably thickened. Electronically Signed   By: Bretta BangWilliam  Woodruff III M.D.   On: 11/18/2017 17:28   [place] Ob Transvaginal  Result Date: 11/20/2017 CLINICAL DATA:  Acute onset of vaginal bleeding and pelvic cramping. EXAM: TRANSVAGINAL OB ULTRASOUND TECHNIQUE: Transvaginal ultrasound was performed for complete evaluation of the gestation as well as the maternal uterus, adnexal  regions, and pelvic cul-de-sac. COMPARISON:  Pelvic ultrasound performed 11/18/2017 FINDINGS: Intrauterine gestational sac: None seen. Yolk sac:  N/A Embryo:  N/A Subchorionic hemorrhage:  None visualized. Maternal uterus/adnexae: The uterus is unremarkable in appearance The ovaries are within normal limits. The right ovary measures 2.8 x 1.9 x 1.9 cm, while the left ovary measures 2.8 x 2.0 x 2.0 cm. No suspicious adnexal masses are seen; there is no evidence for ovarian torsion. No free fluid is seen within the pelvic cul-de-sac. IMPRESSION: Unremarkable pelvic ultrasound. No evidence of ectopic pregnancy. No intrauterine gestational sac seen at this time. Electronically Signed   By: Roanna RaiderJeffery  Chang M.D.   On: 11/20/2017 22:25   [place] Ob Transvaginal  Result Date: 11/18/2017 CLINICAL DATA:  Pelvic pain and vaginal bleeding EXAM: OBSTETRIC <14 WK US AND TRANSVAGINAL OB US TECHNIQUE: Both transabdominal and transvaginal ultrasound examinations were performed for complete evaluation of the gestation as well as the maternal uterus, adnexal regions, and pelvic cul-de-sac. Transvaginal technique was performed to assess early pregnancy. COMPARISON:  None. FINDINGS: Intrauterine gestational sac: Not visualized Yolk sac:  Not visualized Embryo:  Not visualized Cardiac Activity: Not visualized Subchorionic hemorrhage:  None visualized. Maternal uterus/adnexae: Uterus measures 8.2 x 4.8 x 4.8 cm. There is no intrauterine mass. Endometrium measures 12 mm with smooth contour. Left ovary measures 2.2 x 1.7 x 1.8 cm. Right ovary measures 3.8 x 1.8 x 2.5 cm. There is no extrauterine pelvic or adnexal mass. No free pelvic fluid. IMPRESSION: No intrauterine gestation is apparent. Assuming positive pregnancy test, differential considerations must include intrauterine gestation too early to be seen by either transabdominal or transvaginal technique; recent spontaneous abortion; ectopic gestation. In this regard, close clinical and  laboratory surveillance advised.  Timing of repeat imaging will depend largely on beta HCG values going forward. No intrauterine or extrauterine pelvic or adnexal mass. No free pelvic fluid. Endometrium not appreciably thickened. Electronically Signed   By: Bretta Bang III M.D.   On: 11/18/2017 17:28   Ct Abdomen Pelvis W Contrast  Result Date: 11/23/2017 CLINICAL DATA:  Abdominal pain and vomiting EXAM: CT ABDOMEN AND PELVIS WITH CONTRAST TECHNIQUE: Multidetector CT imaging of the abdomen and pelvis was performed using the standard protocol following bolus administration of intravenous contrast. By report, patient has decreasing beta HCG value, suspicious for recent spontaneous abortion. Patient did sign pregnancy consent form after discussion with the performing technologist and the emergency department physician. CONTRAST:  100 mL Isovue 370 nonionic COMPARISON:  None. FINDINGS: Lower chest: Lung bases are clear. Hepatobiliary: No focal liver lesions are appreciable. Gallbladder wall is not appreciably thickened. There is no biliary duct dilatation. Pancreas: No pancreatic mass or inflammatory focus. Spleen: No splenic lesions are evident. Adrenals/Urinary Tract: Adrenals appear normal bilaterally. Kidneys bilaterally show no evident mass or hydronephrosis on either side. There is no renal or ureteral calculus on either side. Urinary bladder is midline with wall thickness within normal limits. Stomach/Bowel: There is no bowel wall or mesenteric thickening. No evident bowel obstruction. No free air or portal venous air. Vascular/Lymphatic: There is no abdominal aortic aneurysm. No vascular lesions are evident. No adenopathy is appreciable in the abdomen or pelvis. Reproductive: Uterus is anteverted. There is no pelvic mass. There is a small amount of free fluid in the cul-de-sac. Other: Appendix appears normal. No abscess is seen in the an or pelvis. There is no ascites beyond small amount of fluid in  the cul de sac. Musculoskeletal: There are no blastic or lytic bone lesions. There is no intramuscular or abdominal wall lesion. IMPRESSION: 1. Small amount of free fluid in cul-de-sac. Question upper normal amount of physiologic fluid versus recent ovarian cyst rupture. 2.  No pelvic mass evident.  Appendix appears normal. 3.  No bowel obstruction.  No abscess. 4.  No renal or ureteral calculus.  No hydronephrosis. Electronically Signed   By: Bretta Bang III M.D.   On: 11/23/2017 10:19    MAU COURSE Vitals and nursing notes reviewed - hemodynamically stable I have reviewed labsand imaging that was done <24hrs ago No signs of acute abdomen or acute process such as pancreatitis, obstruction bhcg down trending as appropriate Multiple US showing no intrauterine process  Treatments given in MAU: -Refused GI cocktail -IVF bolus with phenergan given -No episodes of emesis witnessed in MAU -IV toradol with improvement in pain -IV pepcid   MDM Plan of care reviewed with patient, including labs and tests ordered and medical treatment.   ASSESSMENT 1. Epigastric pain   2. Intractable vomiting with nausea, unspecified vomiting type    Most likely feel she has GERD or PUD.  PLAN Discharge home in stable condition GI referral placed for patient Continue PPI Continue antiemetics - has suppository and ODT forms Counseled on return precautions Handout given   Caryl Ada, DO OB [facility], [facility] [place] 11/24/2017, 3:15 AM

## 2017-11-24 NOTE — MAU Note (Addendum)
Pt presents to mau via EMS with abd pain and N/V. Nausea not relieved by prescribed medicines she was given. Pt states she has vomited 10 times in last 24 hours.   0400 offered pt the GI cocktail and she said that she was too nauseated at this time.

## 2017-11-24 NOTE — ED Notes (Signed)
Called pt for vitals with no answer, pt has not been seen to return to ED

## 2017-11-24 NOTE — ED Notes (Signed)
Pt comes to nurse first stating that she called 911 and they told her she was having an emergency and she does not need to wait. Explained process to pt, then pt observed leaving the ED.

## 2017-11-25 ENCOUNTER — Inpatient Hospital Stay (HOSPITAL_COMMUNITY)
Admission: EM | Admit: 2017-11-25 | Discharge: 2017-11-28 | DRG: 779 | Disposition: A | Discharge status: Home / Self Care | Payer: Medicaid Other | Attending: Family Medicine | Admitting: Family Medicine

## 2017-11-25 ENCOUNTER — Other Ambulatory Visit: Payer: Self-pay

## 2017-11-25 ENCOUNTER — Emergency Department (HOSPITAL_COMMUNITY): Admission: EM | Admit: 2017-11-25 | Discharge: 2017-11-25 | Discharge status: Absent without leave | Payer: Self-pay

## 2017-11-25 ENCOUNTER — Encounter (HOSPITAL_COMMUNITY): Payer: Self-pay

## 2017-11-25 DIAGNOSIS — J4520 Mild intermittent asthma, uncomplicated: Secondary | ICD-10-CM | POA: Diagnosis present

## 2017-11-25 DIAGNOSIS — F1210 Cannabis abuse, uncomplicated: Secondary | ICD-10-CM | POA: Diagnosis present

## 2017-11-25 DIAGNOSIS — K920 Hematemesis: Secondary | ICD-10-CM | POA: Diagnosis present

## 2017-11-25 DIAGNOSIS — E876 Hypokalemia: Secondary | ICD-10-CM | POA: Diagnosis present

## 2017-11-25 DIAGNOSIS — N3001 Acute cystitis with hematuria: Secondary | ICD-10-CM | POA: Diagnosis not present

## 2017-11-25 DIAGNOSIS — N939 Abnormal uterine and vaginal bleeding, unspecified: Secondary | ICD-10-CM | POA: Diagnosis present

## 2017-11-25 DIAGNOSIS — B9689 Other specified bacterial agents as the cause of diseases classified elsewhere: Secondary | ICD-10-CM | POA: Diagnosis not present

## 2017-11-25 DIAGNOSIS — A5901 Trichomonal vulvovaginitis: Secondary | ICD-10-CM | POA: Diagnosis not present

## 2017-11-25 DIAGNOSIS — F12988 Cannabis use, unspecified with other cannabis-induced disorder: Secondary | ICD-10-CM

## 2017-11-25 DIAGNOSIS — N760 Acute vaginitis: Secondary | ICD-10-CM | POA: Diagnosis not present

## 2017-11-25 DIAGNOSIS — O0383 Metabolic disorder following complete or unspecified spontaneous abortion: Secondary | ICD-10-CM | POA: Diagnosis present

## 2017-11-25 DIAGNOSIS — N390 Urinary tract infection, site not specified: Secondary | ICD-10-CM | POA: Diagnosis present

## 2017-11-25 DIAGNOSIS — R1116 Cannabis hyperemesis syndrome: Secondary | ICD-10-CM | POA: Insufficient documentation

## 2017-11-25 DIAGNOSIS — F1290 Cannabis use, unspecified, uncomplicated: Secondary | ICD-10-CM | POA: Insufficient documentation

## 2017-11-25 DIAGNOSIS — Z87891 Personal history of nicotine dependence: Secondary | ICD-10-CM

## 2017-11-25 DIAGNOSIS — D62 Acute posthemorrhagic anemia: Secondary | ICD-10-CM | POA: Diagnosis present

## 2017-11-25 DIAGNOSIS — F1910 Other psychoactive substance abuse, uncomplicated: Secondary | ICD-10-CM

## 2017-11-25 DIAGNOSIS — Z87440 Personal history of urinary (tract) infections: Secondary | ICD-10-CM

## 2017-11-25 DIAGNOSIS — E860 Dehydration: Secondary | ICD-10-CM | POA: Diagnosis present

## 2017-11-25 DIAGNOSIS — O0388 Urinary tract infection following complete or unspecified spontaneous abortion: Principal | ICD-10-CM | POA: Diagnosis present

## 2017-11-25 DIAGNOSIS — Z79899 Other long term (current) drug therapy: Secondary | ICD-10-CM

## 2017-11-25 DIAGNOSIS — R112 Nausea with vomiting, unspecified: Secondary | ICD-10-CM | POA: Diagnosis present

## 2017-11-25 DIAGNOSIS — K219 Gastro-esophageal reflux disease without esophagitis: Secondary | ICD-10-CM | POA: Diagnosis present

## 2017-11-25 HISTORY — DX: Urinary tract infection, site not specified: N39.0

## 2017-11-25 HISTORY — DX: Trichomonal vulvovaginitis: A59.01

## 2017-11-25 HISTORY — DX: Hematemesis: K92.0

## 2017-11-25 HISTORY — DX: Hypokalemia: E87.6

## 2017-11-25 HISTORY — DX: Other specified bacterial agents as the cause of diseases classified elsewhere: B96.89

## 2017-11-25 HISTORY — DX: Nausea with vomiting, unspecified: R11.2

## 2017-11-25 LAB — URINALYSIS, ROUTINE W REFLEX MICROSCOPIC
Bilirubin Urine: NEGATIVE
Glucose, UA: NEGATIVE mg/dL
Ketones, ur: 80 mg/dL — AB
Leukocytes, UA: NEGATIVE
Nitrite: NEGATIVE
Protein, ur: NEGATIVE mg/dL
Specific Gravity, Urine: 1.015 (ref 1.005–1.030)
pH: 6 (ref 5.0–8.0)

## 2017-11-25 LAB — COMPREHENSIVE METABOLIC PANEL
ALT: 12 U/L — ABNORMAL LOW (ref 14–54)
AST: 19 U/L (ref 15–41)
Albumin: 4.2 g/dL (ref 3.5–5.0)
Alkaline Phosphatase: 40 U/L (ref 38–126)
Anion gap: 14 (ref 5–15)
BUN: 10 mg/dL (ref 6–20)
CO2: 20 mmol/L — ABNORMAL LOW (ref 22–32)
Calcium: 8.4 mg/dL — ABNORMAL LOW (ref 8.9–10.3)
Chloride: 102 mmol/L (ref 101–111)
Creatinine, Ser: 0.66 mg/dL (ref 0.44–1.00)
GFR calc Af Amer: >60 mL/min (ref >60)
GFR calc non Af Amer: >60 mL/min (ref >60)
Glucose, Bld: 80 mg/dL (ref 65–99)
Potassium: 3.2 mmol/L — ABNORMAL LOW (ref 3.5–5.1)
Sodium: 136 mmol/L (ref 135–145)
Total Bilirubin: 1.2 mg/dL (ref 0.3–1.2)
Total Protein: 7.4 g/dL (ref 6.5–8.1)

## 2017-11-25 LAB — CBC
HCT: 38 % (ref 36.0–46.0)
Hemoglobin: 13.1 g/dL (ref 12.0–15.0)
MCH: 30.6 pg (ref 26.0–34.0)
MCHC: 34.5 g/dL (ref 30.0–36.0)
MCV: 88.8 fL (ref 78.0–100.0)
Platelets: 260 10*3/uL (ref 150–400)
RBC: 4.28 MIL/uL (ref 3.87–5.11)
RDW: 14.2 % (ref 11.5–15.5)
WBC: 11.7 10*3/uL — ABNORMAL HIGH (ref 4.0–10.5)

## 2017-11-25 LAB — I-STAT BETA HCG BLOOD, ED (MC, WL, AP ONLY): I-stat hCG, quantitative: 202.2 m[IU]/mL — ABNORMAL HIGH (ref <5)

## 2017-11-25 LAB — RAPID URINE DRUG SCREEN, HOSP PERFORMED
Amphetamines: NOT DETECTED
Barbiturates: POSITIVE — AB
Benzodiazepines: NOT DETECTED
Cocaine: NOT DETECTED
Opiates: NOT DETECTED
Tetrahydrocannabinol: POSITIVE — AB

## 2017-11-25 LAB — LIPASE, BLOOD: Lipase: 21 U/L (ref 11–51)

## 2017-11-25 MED ORDER — CAPSAICIN 0.025 % EX CREA
TOPICAL_CREAM | Freq: Once | CUTANEOUS | Status: AC
  Administered 2017-11-25: 23:00:00 via TOPICAL
  Filled 2017-11-25: qty 60

## 2017-11-25 MED ORDER — DIPHENHYDRAMINE HCL 50 MG/ML IJ SOLN
25.0000 mg | Freq: Once | INTRAMUSCULAR | Status: AC
  Administered 2017-11-25: 25 mg via INTRAVENOUS
  Filled 2017-11-25: qty 1

## 2017-11-25 MED ORDER — ACETAMINOPHEN 325 MG PO TABS: 650.0000 mg | ORAL_TABLET | Freq: Four times a day (QID) | ORAL | Status: DC | PRN

## 2017-11-25 MED ORDER — DEXTROSE 5 % IV SOLN: 1.0000 g | Freq: Once | INTRAVENOUS | Status: DC

## 2017-11-25 MED ORDER — ONDANSETRON HCL 4 MG/2ML IJ SOLN
4.0000 mg | Freq: Once | INTRAMUSCULAR | Status: AC
  Administered 2017-11-25: 4 mg via INTRAVENOUS
  Filled 2017-11-25: qty 2

## 2017-11-25 MED ORDER — ONDANSETRON HCL 4 MG/2ML IJ SOLN
4.0000 mg | Freq: Three times a day (TID) | INTRAMUSCULAR | Status: DC | PRN
  Administered 2017-11-26 (×2): 4 mg via INTRAVENOUS
  Filled 2017-11-25 (×2): qty 2

## 2017-11-25 MED ORDER — SODIUM CHLORIDE 0.9 % IV SOLN
1000.0000 mL | INTRAVENOUS | Status: DC
  Administered 2017-11-26: 500 mL via INTRAVENOUS
  Administered 2017-11-26: 1000 mL via INTRAVENOUS

## 2017-11-25 MED ORDER — ZOLPIDEM TARTRATE 5 MG PO TABS: 5.0000 mg | ORAL_TABLET | Freq: Every evening | ORAL | Status: DC | PRN

## 2017-11-25 MED ORDER — FAMOTIDINE IN NACL 20-0.9 MG/50ML-% IV SOLN
20.0000 mg | Freq: Once | INTRAVENOUS | Status: AC
  Administered 2017-11-25: 20 mg via INTRAVENOUS
  Filled 2017-11-25: qty 50

## 2017-11-25 MED ORDER — POTASSIUM CHLORIDE 10 MEQ/100ML IV SOLN
10.0000 meq | INTRAVENOUS | Status: AC
  Administered 2017-11-26 (×3): 10 meq via INTRAVENOUS
  Filled 2017-11-25 (×3): qty 100

## 2017-11-25 MED ORDER — POTASSIUM CHLORIDE IN NACL 20-0.9 MEQ/L-% IV SOLN
Freq: Once | INTRAVENOUS | Status: DC
  Filled 2017-11-25: qty 1000

## 2017-11-25 MED ORDER — SODIUM CHLORIDE 0.9 % IV SOLN
8.0000 mg/h | INTRAVENOUS | Status: DC
  Administered 2017-11-26 (×3): 8 mg/h via INTRAVENOUS
  Filled 2017-11-25 (×5): qty 80

## 2017-11-25 MED ORDER — SODIUM CHLORIDE 0.9 % IV SOLN
1000.0000 mL | INTRAVENOUS | Status: DC
  Administered 2017-11-25: 1000 mL via INTRAVENOUS

## 2017-11-25 MED ORDER — METRONIDAZOLE IN NACL 5-0.79 MG/ML-% IV SOLN
500.0000 mg | Freq: Three times a day (TID) | INTRAVENOUS | Status: DC
  Administered 2017-11-26 – 2017-11-28 (×8): 500 mg via INTRAVENOUS
  Filled 2017-11-25 (×10): qty 100

## 2017-11-25 MED ORDER — PANTOPRAZOLE SODIUM 40 MG IV SOLR
40.0000 mg | Freq: Once | INTRAVENOUS | Status: AC
  Administered 2017-11-25: 40 mg via INTRAVENOUS
  Filled 2017-11-25: qty 40

## 2017-11-25 MED ORDER — ACETAMINOPHEN 650 MG RE SUPP: 650.0000 mg | Freq: Four times a day (QID) | RECTAL | Status: DC | PRN

## 2017-11-25 MED ORDER — DEXTROSE 5 % IV SOLN
1.0000 g | INTRAVENOUS | Status: DC
  Administered 2017-11-26: 1 g via INTRAVENOUS
  Filled 2017-11-25: qty 10

## 2017-11-25 MED ORDER — SODIUM CHLORIDE 0.9 % IV BOLUS (SEPSIS)
1000.0000 mL | Freq: Once | INTRAVENOUS | Status: AC
  Administered 2017-11-25: 1000 mL via INTRAVENOUS

## 2017-11-25 MED ORDER — CEFTRIAXONE SODIUM 1 G IJ SOLR
1.0000 g | Freq: Once | INTRAMUSCULAR | Status: AC
  Administered 2017-11-25: 1 g via INTRAVENOUS
  Filled 2017-11-25: qty 10

## 2017-11-25 MED ORDER — PANTOPRAZOLE SODIUM 40 MG IV SOLR: 40.0000 mg | Freq: Two times a day (BID) | INTRAVENOUS | Status: DC

## 2017-11-25 MED ORDER — METOCLOPRAMIDE HCL 5 MG/ML IJ SOLN
10.0000 mg | Freq: Once | INTRAMUSCULAR | Status: AC
  Administered 2017-11-25: 10 mg via INTRAVENOUS
  Filled 2017-11-25: qty 2

## 2017-11-25 MED ORDER — METOCLOPRAMIDE HCL 5 MG/ML IJ SOLN
5.0000 mg | Freq: Three times a day (TID) | INTRAMUSCULAR | Status: DC
Start: 2017-11-25 — End: 2017-11-28
  Administered 2017-11-26 (×3): 5 mg via INTRAVENOUS
  Filled 2017-11-25 (×4): qty 2

## 2017-11-25 MED ORDER — POTASSIUM CHLORIDE 10 MEQ/100ML IV SOLN
10.0000 meq | Freq: Once | INTRAVENOUS | Status: AC
  Administered 2017-11-25: 10 meq via INTRAVENOUS
  Filled 2017-11-25: qty 100

## 2017-11-25 NOTE — ED Provider Notes (Signed)
[facility]-EMERGENCY DEPT Provider Note   CSN: [id number] Arrival date & time: 11/25/17  1529     History   Chief Complaint Chief Complaint  Patient presents with  . Hematemesis  . Abdominal Pain    HPI Vicki Daniel is a 21 y.o. female.  HPI Patient reports treatment with mifepristone/misoprostol 10 days ago.  She reports since that time she has had recurrent vomiting and upper abdominal epigastric and central pain.  She denies the pain has been severe in her lower abdomen or pelvis.  She reports she does continue to have some passage of blood and clots but not excessively.  She reports anytime she tries to eat something she gets abdominal pain and vomits.  She reports she is vomited up to 10 times today.  She reports sometimes she is seeing some blood in the emesis. Past Medical History:  Diagnosis Date  . Asthma     There are no active problems to display for this patient.   Past Surgical History:  Procedure Laterality Date  . WISDOM TOOTH EXTRACTION      OB History    Gravida Para Term Preterm AB Living   3 0 0 0 2 0   SAB TAB Ectopic Multiple Live Births   1 0 0 0 0       Home Medications    Prior to Admission medications   Medication Sig Start Date End Date Taking? Authorizing Provider  metroNIDAZOLE (FLAGYL) 500 MG tablet Take 500 mg by mouth 2 (two) times daily. 11/18/17  Yes [provider]  ondansetron (ZOFRAN-ODT) 4 MG disintegrating tablet Take 4 mg by mouth every 4 (four) hours as needed. 11/22/17  Yes [provider]  promethazine (PHENERGAN) 25 MG suppository Place 1 suppository (25 mg total) rectally every 6 (six) hours as needed for nausea or vomiting. 11/23/17  Yes Melene PlanFloyd, Dan, DO  pantoprazole (PROTONIX) 40 MG tablet Take 1 tablet (40 mg total) by mouth daily. Patient not taking: Reported on 11/23/2017 11/22/17   Judeth HornLawrence, Erin, NP    Family History History reviewed. No pertinent family  history.  Social History Social History   Tobacco Use  . Smoking status: Former Smoker    Last attempt to quit: 04/22/2016    Years since quitting: 1.5  . Smokeless tobacco: Never Used  Substance Use Topics  . Alcohol use: Yes  . Drug use: Yes    Types: Marijuana     Allergies   Patient has no known allergies.   Review of Systems Review of Systems 10 Systems reviewed and are negative for acute change except as noted in the HPI.   Physical Exam Updated Vital Signs BP 128/73 (BP Location: Left Arm)   Pulse 60   Temp 98.1 F (36.7 C) (Oral)   Resp 20   SpO2 99%   Physical Exam  Constitutional: She is oriented to person, place, and time. She appears well-developed and well-nourished.  Patient appears ill and uncomfortable but is nontoxic, alert in no respiratory distress.  HENT:  Head: Normocephalic and atraumatic.  Eyes: Conjunctivae are normal.  Neck: Neck supple.  Cardiovascular: Normal rate and regular rhythm.  No murmur heard. Pulmonary/Chest: Effort normal and breath sounds normal. No respiratory distress.  Abdominal: Soft. There is no tenderness.  Mild to moderate epigastric tenderness to palpation.  Lower abdomen nontender without guarding.  Musculoskeletal: Normal range of motion. She exhibits no edema or tenderness.  Neurological: She is alert and oriented to person, place, and  time. No cranial nerve deficit. She exhibits normal muscle tone. Coordination normal.  Skin: Skin is warm and dry.  Psychiatric: She has a normal mood and affect.  Nursing note and vitals reviewed.    ED Treatments / Results  Labs (all labs ordered are listed, but only abnormal results are displayed) Labs Reviewed  COMPREHENSIVE METABOLIC PANEL - Abnormal; Notable for the following components:      Result Value   Potassium 3.2 (*)    CO2 20 (*)    Calcium 8.4 (*)    ALT 12 (*)    All other components within normal limits  CBC - Abnormal; Notable for the following  components:   WBC 11.7 (*)    All other components within normal limits  URINALYSIS, ROUTINE W REFLEX MICROSCOPIC - Abnormal; Notable for the following components:   Hgb urine dipstick LARGE (*)    Ketones, ur 80 (*)    Bacteria, UA RARE (*)    Squamous Epithelial / LPF 0-5 (*)    All other components within normal limits  RAPID URINE DRUG SCREEN, HOSP PERFORMED - Abnormal; Notable for the following components:   Tetrahydrocannabinol POSITIVE (*)    Barbiturates POSITIVE (*)    All other components within normal limits  URINE CULTURE  LIPASE, BLOOD  I-STAT BETA HCG BLOOD, ED ([facility], [facility], [facility] ONLY)    EKG  EKG Interpretation None       Radiology No results found.  Procedures Procedures (including critical care time)  Medications Ordered in ED Medications  sodium chloride 0.9 % bolus 1,000 mL (0 mLs Intravenous Stopped 11/25/17 1910)    Followed by  sodium chloride 0.9 % bolus 1,000 mL (0 mLs Intravenous Stopped 11/25/17 1911)    Followed by  0.9 %  sodium chloride infusion (0 mLs Intravenous Stopped 11/25/17 2300)  0.9 % NaCl with KCl 20 mEq/ L  infusion (not administered)  cefTRIAXone (ROCEPHIN) 1 g in dextrose 5 % 50 mL IVPB (1 g Intravenous New Bag/Given 11/25/17 2300)  pantoprazole (PROTONIX) injection 40 mg (40 mg Intravenous Given 11/25/17 1719)  ondansetron (ZOFRAN) injection 4 mg (4 mg Intravenous Given 11/25/17 1719)  metoCLOPramide (REGLAN) injection 10 mg (10 mg Intravenous Given 11/25/17 2046)  diphenhydrAMINE (BENADRYL) injection 25 mg (25 mg Intravenous Given 11/25/17 2047)  famotidine (PEPCID) IVPB 20 mg premix (0 mg Intravenous Stopped 11/25/17 2144)  potassium chloride 10 mEq in 100 mL IVPB (0 mEq Intravenous Stopped 11/25/17 2300)  capsaicin (ZOSTRIX) 0.025 % cream ( Topical Given 11/25/17 2259)     Initial Impression / Assessment and Plan / ED Course  I have reviewed the triage vital signs and the nursing notes.  Pertinent labs & imaging results that  were available during my care of the patient were reviewed by me and considered in my medical decision making (see chart for details).    Consult: GYN Dr. Jolayne Pantheronstant.  Reviewed patient's treatment with Misoprostol.  She advises that this not likely to be related to the patient's ongoing epigastric pain nausea and vomiting.  HCG is dropping as expected.  Patient had normal ultrasound done at a [facility] on the 10th showing cleared uterus.  All things considered, symptoms are most likely secondary to cannabis hyperemesis.  She can follow-up with GYN on outpatient basis.  Final Clinical Impressions(s) / ED Diagnoses   Final diagnoses:  Cannabinoid hyperemesis syndrome (HCC)  Acute cystitis with hematuria   Patient presents as outlined above.  This time there do not appear to  be complications of induced abortion through misoprostol/mifoprostone.  Findings are most consistent with cannabis hyperemesis.  Patient does however have a UTI.  She has had recurrent dry heaves and vomiting throughout treatment.  Each time she has been discharged, she reports she resumes vomiting as soon as she tries to take anything orally.  At this time patient will need admission on fluids and antiemetics until she can tolerate her antibiotics orally. ED Discharge Orders    None       Arby Barrette, MD 11/25/17 2306

## 2017-11-25 NOTE — H&P (Signed)
History and Physical    Vicki ShoeStacy Lombardozzi ZOX:[id number] DOB: 03/24/1996 DOA: 11/25/2017  Referring MD/NP/PA:   PCP: Patient, No Pcp Per   Patient coming from:  The patient is coming from home.  At baseline, pt is independent for most of ADL.      Chief Complaint: Intractable nausea and vomiting, abdominal pain, hematemesis  HPI: Vicki Daniel is a 21 y.o. female with medical history significant of asthma, GERD, marijuana abuse, who presents with intractable nausea, vomiting, abdominal pain and hematemesis.  Pt states that she took an abortion pill last Thursday. Patient states that this is not her first time taking this pill but states that she went to a different location this time. She states that started having nausea, vomiting at the same day. She also has has epigastric abdominal pain, which is constant, 9-10 out of 10 in severity, nonradiating. And sometimes she has hematemesis with dark colored blood and occasionally with bright red colored blood. She vomited more than 10 times today. Denies diarrhea. No fever or chills.She states that her vaginal bleeding has been decreasing, but still has mild bleeding. Patient does not have chest pain, shortness breath, cough. She denies symptoms of UTI. Patient was seen in the ED for several times. She had transvaginal ultrasound on 11/20/17, which showed no evidence of ectopic pregnancy and no intrauterine gestational sac. CT abdomen/pelvis on 11/23/17 was not impressive except for having showed small amount of fluid in cul-de-sac. Of note, pt had wet prep on 11/18/17 which was positive for Trichomonas and clue cells. She also had positive UA on 11/23/17, with moderate amount of leukocytes. Pt states that she was given prescription for Flagyl, but she only took one pill so far due to severe nausea and vomiting.  ED Course: pt was found to have WBC 11.7, positive UDS for barbiturates and THC, lipase 20,Potassium is 3.2, creatinine normal, urinalysis negative  for leukocytes and nitrites, but showed TMTC of WBC, beta hCG 6142 on 11/18/17-->364.7 yesterday. Temperature normal, bradycardia, oxygen saturation 99% on room air. Patient is placed on MedSurg bed for observation. Dr. Jolayne Pantheronstant of Gyn was consulted by EDP.  Review of Systems:   General: no fevers, chills, no body weight gain, has poor appetite, has fatigue HEENT: no blurry vision, hearing changes or sore throat Respiratory: no dyspnea, coughing, wheezing CV: no chest pain, no palpitations GI: has nausea, vomiting, abdominal pain, and Hematemesis no diarrhea, constipation GU: no dysuria, burning on urination, increased urinary frequency, hematuria . Has vaginal bleeding Ext: no leg edema Neuro: no unilateral weakness, numbness, or tingling, no vision change or hearing loss Skin: no rash, no skin tear. MSK: No muscle spasm, no deformity, no limitation of range of movement in spin Heme: No easy bruising.  Travel history: No recent long distant travel.  Allergy: No Known Allergies  Past Medical History:  Diagnosis Date  . Asthma     Past Surgical History:  Procedure Laterality Date  . WISDOM TOOTH EXTRACTION      Social History:  reports that she quit smoking about 19 months ago. she has never used smokeless tobacco. She reports that she drinks alcohol. She reports that she uses drugs. Drug: Marijuana.  Family History:  Family History  Problem Relation Age of Onset  . Hypertension Father      Prior to Admission medications   Medication Sig Start Date End Date Taking? Authorizing Provider  metroNIDAZOLE (FLAGYL) 500 MG tablet Take 500 mg by mouth 2 (two) times daily. 11/18/17  Yes  [provider]  ondansetron (ZOFRAN-ODT) 4 MG disintegrating tablet Take 4 mg by mouth every 4 (four) hours as needed. 11/22/17  Yes [provider]  promethazine (PHENERGAN) 25 MG suppository Place 1 suppository (25 mg total) rectally every 6 (six) hours as needed for nausea or  vomiting. 11/23/17  Yes Melene Plan, DO  pantoprazole (PROTONIX) 40 MG tablet Take 1 tablet (40 mg total) by mouth daily. Patient not taking: Reported on 11/23/2017 11/22/17   Judeth Horn, NP    Physical Exam: Vitals:   11/25/17 1538 11/25/17 1723 11/25/17 2145 11/25/17 2358  BP: 119/79 113/71 128/73 122/77  Pulse: 97 (!) 56 60 60  Resp:  20 20 20   Temp: 98.1 F (36.7 C)  98.1 F (36.7 C)   TempSrc: Oral  Oral   SpO2: 99% 100% 99% 100%   General: Not in acute distress HEENT:       Eyes: PERRL, EOMI, no scleral icterus.       ENT: No discharge from the ears and nose, no pharynx injection, no tonsillar enlargement.        Neck: No JVD, no bruit, no mass felt. Heme: No neck lymph node enlargement. Cardiac: S1/S2, RRR, No murmurs, No gallops or rubs. Respiratory: No rales, wheezing, rhonchi or rubs. GI: Soft, nondistended, has tenderness in Epigastric area, no rebound pain, no organomegaly, BS present. GU: has of vaginal bleeding Ext: No pitting leg edema bilaterally. 2+DP/PT pulse bilaterally. Musculoskeletal: No joint deformities, No joint redness or warmth, no limitation of ROM in spin. Skin: No rashes.  Neuro: Alert, oriented X3, cranial nerves II-XII grossly intact, moves all extremities normally.  Psych: Patient is not psychotic, no suicidal or hemocidal ideation.  Labs on Admission: I have personally reviewed following labs and imaging studies  CBC: Recent Labs  Lab 11/20/17 2105 11/21/17 2014 11/23/17 0807 11/24/17 0015 11/25/17 1544  WBC 11.1* 8.7 9.4 9.2 11.7*  HGB 12.4 12.3 12.5 12.4 13.1  HCT 37.0 36.0 36.4 36.7 38.0  MCV 90.2 90.5 88.8 88.2 88.8  PLT 232 219 216 254 260   Basic Metabolic Panel: Recent Labs  Lab 11/20/17 2105 11/21/17 2014 11/23/17 0807 11/24/17 0015 11/25/17 1544  NA 138 138 137 134* 136  K 3.3* 3.2* 2.8* 3.0* 3.2*  CL 103 104 102 102 102  CO2 21* 22 24 21* 20*  GLUCOSE 96 88 95 86 80  BUN 8 9 11  5* 10  CREATININE 0.71 0.66  0.65 0.77 0.66  CALCIUM 8.5* 8.7* 8.6* 8.4* 8.4*   GFR: Estimated Creatinine Clearance: 117.2 mL/min (by C-G formula based on SCr of 0.66 mg/dL). Liver Function Tests: Recent Labs  Lab 11/20/17 2105 11/21/17 2014 11/23/17 0807 11/24/17 0015 11/25/17 1544  AST 19 18 21 18 19   ALT 12* 13* 14 12* 12*  ALKPHOS 40  --  39 36* 40  BILITOT 0.6  --  0.9 0.7 1.2  PROT 6.6  --  7.1 6.4* 7.4  ALBUMIN 3.7  --  4.0 3.7 4.2   Recent Labs  Lab 11/23/17 0807 11/24/17 0015 11/25/17 1544  LIPASE 19 20 21    No results for input(s): AMMONIA in the last 168 hours. Coagulation Profile: No results for input(s): INR, PROTIME in the last 168 hours. Cardiac Enzymes: No results for input(s): CKTOTAL, CKMB, CKMBINDEX, TROPONINI in the last 168 hours. BNP (last 3 results) No results for input(s): PROBNP in the last 8760 hours. HbA1C: No results for input(s): HGBA1C in the last 72 hours. CBG: No  results for input(s): GLUCAP in the last 168 hours. Lipid Profile: No results for input(s): CHOL, HDL, LDLCALC, TRIG, CHOLHDL, LDLDIRECT in the last 72 hours. Thyroid Function Tests: No results for input(s): TSH, T4TOTAL, FREET4, T3FREE, THYROIDAB in the last 72 hours. Anemia Panel: No results for input(s): VITAMINB12, FOLATE, FERRITIN, TIBC, IRON, RETICCTPCT in the last 72 hours. Urine analysis:    Component Value Date/Time   COLORURINE YELLOW 11/25/2017 1544   APPEARANCEUR CLEAR 11/25/2017 1544   LABSPEC 1.015 11/25/2017 1544   PHURINE 6.0 11/25/2017 1544   GLUCOSEU NEGATIVE 11/25/2017 1544   HGBUR LARGE (A) 11/25/2017 1544   BILIRUBINUR NEGATIVE 11/25/2017 1544   KETONESUR 80 (A) 11/25/2017 1544   PROTEINUR NEGATIVE 11/25/2017 1544   NITRITE NEGATIVE 11/25/2017 1544   LEUKOCYTESUR NEGATIVE 11/25/2017 1544   Sepsis Labs: @LABRCNTIP (procalcitonin:4,lacticidven:4) ) Recent Results (from the past 240 hour(s))  Wet prep, genital     Status: Abnormal   Collection Time: 11/18/17  4:29 PM    Result Value Ref Range Status   Yeast Wet Prep HPF POC NONE SEEN NONE SEEN Final   Trich, Wet Prep PRESENT (A) NONE SEEN Final   Clue Cells Wet Prep HPF POC PRESENT (A) NONE SEEN Final   WBC, Wet Prep HPF POC MANY (A) NONE SEEN Final    Comment: MANY BACTERIA SEEN   Sperm NONE SEEN  Final     Radiological Exams on Admission: No results found.   EKG:  Not done in ED, will get one.   Assessment/Plan Principal Problem:   Intractable nausea and vomiting Active Problems:   Acute lower UTI   Hypokalemia   Trichomonas vaginitis   Bacterial vaginosis   Hematemesis   Substance abuse (HCC)   Intractable nausea and vomiting, epigastric pain and hematemesis: Etiology is not clear, likely due to multifactorial etiology, including marijuana abuse and possible UTI. Other differential diagnoses is viral gastritis. hgb stable 13.1. Hemodynamically stable.  - will place on med-surg bed for obs - Reglan 5 mg tid and prn zofran - IVF: 2L NS, then 150 cc/h - Start IV pantoprazole gtt - Avoid NSAIDs and SQ heparin - Maintain IV access (2 large bore IVs if possible). - Monitor closely and follow q6h cbc, transfuse as necessary if Hgb<7.0 - LaB: INR, PTT and type screen  Possible UTI: -IV rocephin -f/u Bx and Ux  Trichomonas vaginitis and Bacterial vaginosis: -IV Flagyl -check GC and chlamydia, HIV antibody  Hypokalemia: K=3.2 on admission. - Repleted - Check Mg level  Substance abuse: UDS is positive for marijuana and barbiturates. -Did counseling about importance of quitting substance use.  Recent abortion by taking abortion pill: EDP consulted GYN, Dr. Jolayne Pantheronstant. "Reviewed patient's treatment with Misoprostol.  She advises that this not likely to be related to the patient's ongoing epigastric pain nausea and vomiting.  HCG is dropping as expected.  Patient had normal ultrasound done at a [facility] on the 10th showing cleared uterus" -follow-up with GYN on outpatient  basis.     DVT ppx: SCD Code Status: Full code Family Communication: Yes, patient's  father  at bed side Disposition Plan:  Anticipate discharge back to previous home environment Consults called:  GYN, Dr. Jolayne Pantheronstant Admission status:  medical floor/obs      Date of Service 11/26/2017    Lorretta HarpXilin Shiloh Swopes [facility] Pager [phone]  If 7PM-7AM, please contact night-coverage [email or web address] Password [email or web address] 11/26/2017, 12:25 AM

## 2017-11-25 NOTE — ED Triage Notes (Signed)
Pt reports nausea, epigastric abdominal pain and hematemesis x 4-5 days. She reports that she took "an abortion pill" 2 weeks ago and she started feeling ill. Pt is moaning and tearful in triage. Recently seen for same. A&Ox4. Ambulatory.

## 2017-11-25 NOTE — ED Notes (Signed)
Patient reports that she took a pill last Thursday for an abortion. Patient states that this is not her first time taking this pill but states that she went to a different location this time. Patient reports a decrease in vaginal bleeding since last Thursday, but reports an increase in n/v and a decrease in PO intake. Patient in tears. No active emesis at this time.

## 2017-11-26 DIAGNOSIS — A5901 Trichomonal vulvovaginitis: Secondary | ICD-10-CM | POA: Diagnosis present

## 2017-11-26 DIAGNOSIS — F1210 Cannabis abuse, uncomplicated: Secondary | ICD-10-CM | POA: Diagnosis present

## 2017-11-26 DIAGNOSIS — O0388 Urinary tract infection following complete or unspecified spontaneous abortion: Secondary | ICD-10-CM | POA: Diagnosis present

## 2017-11-26 DIAGNOSIS — J4520 Mild intermittent asthma, uncomplicated: Secondary | ICD-10-CM | POA: Diagnosis present

## 2017-11-26 DIAGNOSIS — E876 Hypokalemia: Secondary | ICD-10-CM | POA: Diagnosis present

## 2017-11-26 DIAGNOSIS — Z87440 Personal history of urinary (tract) infections: Secondary | ICD-10-CM | POA: Diagnosis not present

## 2017-11-26 DIAGNOSIS — F1910 Other psychoactive substance abuse, uncomplicated: Secondary | ICD-10-CM

## 2017-11-26 DIAGNOSIS — F12988 Cannabis use, unspecified with other cannabis-induced disorder: Secondary | ICD-10-CM | POA: Diagnosis not present

## 2017-11-26 DIAGNOSIS — N939 Abnormal uterine and vaginal bleeding, unspecified: Secondary | ICD-10-CM | POA: Diagnosis present

## 2017-11-26 DIAGNOSIS — N3001 Acute cystitis with hematuria: Secondary | ICD-10-CM | POA: Diagnosis present

## 2017-11-26 DIAGNOSIS — B9689 Other specified bacterial agents as the cause of diseases classified elsewhere: Secondary | ICD-10-CM

## 2017-11-26 DIAGNOSIS — D62 Acute posthemorrhagic anemia: Secondary | ICD-10-CM | POA: Diagnosis present

## 2017-11-26 DIAGNOSIS — N760 Acute vaginitis: Secondary | ICD-10-CM | POA: Diagnosis not present

## 2017-11-26 DIAGNOSIS — K920 Hematemesis: Secondary | ICD-10-CM

## 2017-11-26 DIAGNOSIS — O0383 Metabolic disorder following complete or unspecified spontaneous abortion: Secondary | ICD-10-CM | POA: Diagnosis present

## 2017-11-26 DIAGNOSIS — K219 Gastro-esophageal reflux disease without esophagitis: Secondary | ICD-10-CM | POA: Diagnosis present

## 2017-11-26 DIAGNOSIS — Z87891 Personal history of nicotine dependence: Secondary | ICD-10-CM | POA: Diagnosis not present

## 2017-11-26 DIAGNOSIS — E860 Dehydration: Secondary | ICD-10-CM | POA: Diagnosis present

## 2017-11-26 DIAGNOSIS — Z79899 Other long term (current) drug therapy: Secondary | ICD-10-CM | POA: Diagnosis not present

## 2017-11-26 DIAGNOSIS — R112 Nausea with vomiting, unspecified: Secondary | ICD-10-CM

## 2017-11-26 LAB — CBC
HCT: 34.2 % — ABNORMAL LOW (ref 36.0–46.0)
HCT: 34.8 % — ABNORMAL LOW (ref 36.0–46.0)
Hemoglobin: 11.5 g/dL — ABNORMAL LOW (ref 12.0–15.0)
Hemoglobin: 11.8 g/dL — ABNORMAL LOW (ref 12.0–15.0)
MCH: 30.3 pg (ref 26.0–34.0)
MCH: 30.2 pg (ref 26.0–34.0)
MCHC: 33.6 g/dL (ref 30.0–36.0)
MCHC: 33.9 g/dL (ref 30.0–36.0)
MCV: 90.2 fL (ref 78.0–100.0)
MCV: 89 fL (ref 78.0–100.0)
Platelets: 231 10*3/uL (ref 150–400)
Platelets: 249 10*3/uL (ref 150–400)
RBC: 3.79 MIL/uL — ABNORMAL LOW (ref 3.87–5.11)
RBC: 3.91 MIL/uL (ref 3.87–5.11)
RDW: 14.2 % (ref 11.5–15.5)
RDW: 14.1 % (ref 11.5–15.5)
WBC: 10 10*3/uL (ref 4.0–10.5)
WBC: 9.2 10*3/uL (ref 4.0–10.5)

## 2017-11-26 LAB — PROTIME-INR
INR: 1.23
Prothrombin Time: 15.4 seconds — ABNORMAL HIGH (ref 11.4–15.2)

## 2017-11-26 LAB — BASIC METABOLIC PANEL
Anion gap: 10 (ref 5–15)
BUN: <5 mg/dL — ABNORMAL LOW (ref 6–20)
CO2: 16 mmol/L — ABNORMAL LOW (ref 22–32)
Calcium: 7.8 mg/dL — ABNORMAL LOW (ref 8.9–10.3)
Chloride: 107 mmol/L (ref 101–111)
Creatinine, Ser: 0.51 mg/dL (ref 0.44–1.00)
GFR calc Af Amer: >60 mL/min (ref >60)
GFR calc non Af Amer: >60 mL/min (ref >60)
Glucose, Bld: 75 mg/dL (ref 65–99)
Potassium: 3.4 mmol/L — ABNORMAL LOW (ref 3.5–5.1)
Sodium: 133 mmol/L — ABNORMAL LOW (ref 135–145)

## 2017-11-26 LAB — GLUCOSE, CAPILLARY
Glucose-Capillary: 65 mg/dL (ref 65–99)
Glucose-Capillary: 157 mg/dL — ABNORMAL HIGH (ref 65–99)

## 2017-11-26 LAB — MAGNESIUM: Magnesium: 1.7 mg/dL (ref 1.7–2.4)

## 2017-11-26 LAB — APTT: aPTT: 32 seconds (ref 24–36)

## 2017-11-26 MED ORDER — DEXTROSE 50 % IV SOLN
INTRAVENOUS | Status: AC
  Administered 2017-11-26: 25 mL via INTRAVENOUS
  Filled 2017-11-26: qty 50

## 2017-11-26 MED ORDER — KCL IN DEXTROSE-NACL 20-5-0.9 MEQ/L-%-% IV SOLN
INTRAVENOUS | Status: DC
  Administered 2017-11-26 – 2017-11-28 (×3): via INTRAVENOUS
  Filled 2017-11-26 (×6): qty 1000

## 2017-11-26 MED ORDER — DEXTROSE 50 % IV SOLN
25.0000 mL | Freq: Once | INTRAVENOUS | Status: AC
  Administered 2017-11-26: 25 mL via INTRAVENOUS

## 2017-11-26 MED ORDER — PROMETHAZINE HCL 25 MG/ML IJ SOLN
25.0000 mg | Freq: Once | INTRAMUSCULAR | Status: AC
  Administered 2017-11-26: 25 mg via INTRAVENOUS
  Filled 2017-11-26: qty 1

## 2017-11-26 NOTE — Progress Notes (Signed)
Patient had 1 episode of emesis after eating applesauce and italian ice. Will advance diet to full liquid for 12/17 am.  Vicki ConroyBrooke M. Clelia CroftShaw, RN

## 2017-11-26 NOTE — Progress Notes (Signed)
Hypoglycemic Event  CBG: 65  Treatment: D50 IV 25 mL  Symptoms: None  Follow-up CBG: Time:0947 CBG Result:157  Possible Reasons for Event: Vomiting and Inadequate meal intake  Comments/MD notified:informed during rounds    Ciela Mahajan M. Clelia CroftShaw, RN

## 2017-11-26 NOTE — Progress Notes (Signed)
PROGRESS NOTE  Vicki ShoeStacy Canizares  WUJ:[id number] DOB: 08/31/1996 DOA: 11/25/2017 PCP: Patient, No Pcp Per   Brief Narrative: Vicki Daniel is a 21 y.o. female with a history of asthma, marijuana use, and intermittent asthma who presented to the ED 12/15 for intractable abdominal pain, nausea and vomiting. She noted severe, worsening emesis several times per day for the previous 9 days that was initially stomach contents, occasionally bilious, and became streaked with red/brown blood recently. She vomiting >10 times on the day of presentation. She smokes marijuana semi-regluarly and used a friend's vape for THC without improvement. She does not improvement with a hot shower, but nothing else. Symptoms began after taking misoprostol for medical pregnancy termination. HCG has trended downward and pelvic U/S confirms absence of gestational sac, she's had vaginal bleeding which has decreased significantly during this time. She was prescribed flagyl for BV and trichomonas on wet prep and had pyuria noted on UA 12/13 but had been unable to take it due to emesis. No diarrhea, notable sick contacts, suspicious ingestions. Denies other drugs, EtOH abuse, NSAID use, melena, hematochezia. She appeared ill, dehydrated with borderline bradycardia, +UDS for THC and barbiturates, though she denies any use of barbiturates. She was given IV fluids, IV antiemetics, and brought in for observation.   Assessment & Plan: Principal Problem:   Intractable nausea and vomiting Active Problems:   Acute lower UTI   Hypokalemia   Trichomonas vaginitis   Bacterial vaginosis   Hematemesis   Substance abuse (HCC)  Intractable nausea, vomiting, epigastric pain: With benign abd exam and CT abd/pelvis 12/13. In the absence of other explanations and the characteristic alleviation with hot shower, suspect symptoms primarily driven by cannabinoid hyperemesis.  - Counseled that I suspect THC is primary cause and that total cessation is  treatment. - Advance diet slowly as tolerated with antiemetics as ordered  Hematemesis: In the setting of intractable vomiting x10 days, strongly suspect Mallory-Weiss tear.  - Will DC PPI infusion and give IV BID, due to no risk factors for ulcer.  - If symptoms do not improve, may need GI input  Normocytic anemia: Acute, suspect more due to vaginal bleeding from POC than hematemesis.  - Monitor. Type and screened.   Dehydration: Unable to tolerate po at this time.  - Continue IV fluids (change to dextrose-containing), electrolyte monitoring and correction, and other supportive treatments as above.   Pyuria: Pt has had UTI in the past with dysuria and denies this or any other urinary symptoms. In the absence of fever, etc. doubt this would contribute to symptoms regardless.  - Monitor cultures, DC CTX if negative  Barbiturate + urine drug screen: Pt seems forthcoming about social history, but denies any knowledge of barbiturate medication/drug use. She was given a hydrocodone pill from her grandmother a few days ago and did smoke a vape that was not hers earlier.  - Counseled to abstain from any possible sources including medications not prescribed for her and any drugs that could potentially contain impurities/alternative substances.   Recent medical abortion: Misoprostol taken 10 days ago with absence of gestational sac confirmed by U/S, HCG appropriately down 6142 > 364.  - Dr. Jolayne Pantheronstant, GYN does not believe this medication is the cause of current presentation.  - This information was not shared with the patient's family per patient's expressed wishes.   Trichomonas vaginitis and bacterial vaginosis: Dx by wet prep. HIV, GC/Chl recently negative. - Flagyl 500mg  BID x7 days  Hypokalemia: Poor po intake, emesis.  -  Replace and recheck.   DVT prophylaxis: SCDs Code Status: Full Family Communication: Father asked to leave room for majority of interview, but did have questions answered.   Disposition Plan: Anticipate DC to home once tolerating po  Consultants:   GYN, Dr. Jolayne Panther by phone by EDP at admission.   Procedures:   None  Antimicrobials:  Ceftriaxone  Flagyl   Subjective: Feels somewhat better, urinating more, but still hasn't been able to eat anything, a couple episodes of stomach contents emesis since arrival. No blood. Still having 1 pad-per-day vaginal bleeding.   Objective: Vitals:   11/26/17 0200 11/26/17 0218 11/26/17 0559 11/26/17 1258  BP:   126/65 123/63  Pulse:   (!) 54 (!) 59  Resp:   20 20  Temp:   97.8 F (36.6 C) 98.5 F (36.9 C)  TempSrc:   Oral Oral  SpO2:   100% 99%  Weight:  77.2 kg (170 lb 4.8 oz)    Height: 5\' 9"  (1.753 m)       Intake/Output Summary (Last 24 hours) at 11/26/2017 1614 Last data filed at 11/26/2017 1504 Gross per 24 hour  Intake 1720.01 ml  Output -  Net 1720.01 ml   Filed Weights   11/26/17 0218  Weight: 77.2 kg (170 lb 4.8 oz)    Gen: Uncomfortable-appearing, thin female in no distress Pulm: Non-labored breathing room air. Clear to auscultation bilaterally.  CV: Regular rate and rhythm. No murmur, rub, or gallop. No JVD, no pedal edema. GI: Abdomen soft, mildly diffusely tender to deep palpation but not severely so. Nondistended with normoactive bowel sounds. No organomegaly or masses felt. Ext: Warm, no deformities Skin: No rashes, lesions no ulcers Neuro: Alert and oriented. No focal neurological deficits. Psych: Judgement and insight appear normal. Mood & affect appropriate.   Data Reviewed: I have personally reviewed following labs and imaging studies  CBC: Recent Labs  Lab 11/23/17 0807 11/24/17 0015 11/25/17 1544 11/26/17 0203 11/26/17 0557  WBC 9.4 9.2 11.7* 10.0 9.2  HGB 12.5 12.4 13.1 11.5* 11.8*  HCT 36.4 36.7 38.0 34.2* 34.8*  MCV 88.8 88.2 88.8 90.2 89.0  PLT 216 254 260 231 249   Basic Metabolic Panel: Recent Labs  Lab 11/21/17 2014 11/23/17 0807 11/24/17 0015  11/25/17 1544 11/26/17 0203 11/26/17 0557  NA 138 137 134* 136  --  133*  K 3.2* 2.8* 3.0* 3.2*  --  3.4*  CL 104 102 102 102  --  107  CO2 22 24 21* 20*  --  16*  GLUCOSE 88 95 86 80  --  75  BUN 9 11 5* 10  --  <5*  CREATININE 0.66 0.65 0.77 0.66  --  0.51  CALCIUM 8.7* 8.6* 8.4* 8.4*  --  7.8*  MG  --   --   --   --  1.7  --    GFR: Estimated Creatinine Clearance: 117.2 mL/min (by C-G formula based on SCr of 0.51 mg/dL). Liver Function Tests: Recent Labs  Lab 11/20/17 2105 11/21/17 2014 11/23/17 0807 11/24/17 0015 11/25/17 1544  AST 19 18 21 18 19   ALT 12* 13* 14 12* 12*  ALKPHOS 40  --  39 36* 40  BILITOT 0.6  --  0.9 0.7 1.2  PROT 6.6  --  7.1 6.4* 7.4  ALBUMIN 3.7  --  4.0 3.7 4.2   Recent Labs  Lab 11/23/17 0807 11/24/17 0015 11/25/17 1544  LIPASE 19 20 21    No results for input(s): AMMONIA  in the last 168 hours. Coagulation Profile: Recent Labs  Lab 11/26/17 0203  INR 1.23   Cardiac Enzymes: No results for input(s): CKTOTAL, CKMB, CKMBINDEX, TROPONINI in the last 168 hours. BNP (last 3 results) No results for input(s): PROBNP in the last 8760 hours. HbA1C: No results for input(s): HGBA1C in the last 72 hours. CBG: Recent Labs  Lab 11/26/17 0837 11/26/17 0947  GLUCAP 65 157*   Lipid Profile: No results for input(s): CHOL, HDL, LDLCALC, TRIG, CHOLHDL, LDLDIRECT in the last 72 hours. Thyroid Function Tests: No results for input(s): TSH, T4TOTAL, FREET4, T3FREE, THYROIDAB in the last 72 hours. Anemia Panel: No results for input(s): VITAMINB12, FOLATE, FERRITIN, TIBC, IRON, RETICCTPCT in the last 72 hours. Urine analysis:    Component Value Date/Time   COLORURINE YELLOW 11/25/2017 1544   APPEARANCEUR CLEAR 11/25/2017 1544   LABSPEC 1.015 11/25/2017 1544   PHURINE 6.0 11/25/2017 1544   GLUCOSEU NEGATIVE 11/25/2017 1544   HGBUR LARGE (A) 11/25/2017 1544   BILIRUBINUR NEGATIVE 11/25/2017 1544   KETONESUR 80 (A) 11/25/2017 1544   PROTEINUR  NEGATIVE 11/25/2017 1544   NITRITE NEGATIVE 11/25/2017 1544   LEUKOCYTESUR NEGATIVE 11/25/2017 1544   Recent Results (from the past 240 hour(s))  Wet prep, genital     Status: Abnormal   Collection Time: 11/18/17  4:29 PM  Result Value Ref Range Status   Yeast Wet Prep HPF POC NONE SEEN NONE SEEN Final   Trich, Wet Prep PRESENT (A) NONE SEEN Final   Clue Cells Wet Prep HPF POC PRESENT (A) NONE SEEN Final   WBC, Wet Prep HPF POC MANY (A) NONE SEEN Final    Comment: MANY BACTERIA SEEN   Sperm NONE SEEN  Final      Radiology Studies: No results found.  Scheduled Meds: . metoCLOPramide (REGLAN) injection  5 mg Intravenous Q8H  . [START ON 11/29/2017] pantoprazole  40 mg Intravenous Q12H   Continuous Infusions: . cefTRIAXone (ROCEPHIN)  IV    . cefTRIAXone (ROCEPHIN)  IV    . dextrose 5 % and 0.9 % NaCl with KCl 20 mEq/L 125 mL/hr at 11/26/17 1223  . metronidazole 500 mg (11/26/17 1537)  . pantoprozole (PROTONIX) infusion Stopped (11/26/17 1538)     LOS: 0 days   Time spent: 25 minutes.  Hazeline Junkeryan Carlis Blanchard, MD [facility] Pager [phone]  If 7PM-7AM, please contact night-coverage [email or web address] Password [email or web address] 11/26/2017, 4:14 PM

## 2017-11-27 LAB — HIV ANTIBODY (ROUTINE TESTING W REFLEX): HIV Screen 4th Generation wRfx: NONREACTIVE

## 2017-11-27 LAB — BASIC METABOLIC PANEL
Anion gap: 6 (ref 5–15)
BUN: <5 mg/dL — ABNORMAL LOW (ref 6–20)
CO2: 23 mmol/L (ref 22–32)
Calcium: 8.3 mg/dL — ABNORMAL LOW (ref 8.9–10.3)
Chloride: 107 mmol/L (ref 101–111)
Creatinine, Ser: 0.54 mg/dL (ref 0.44–1.00)
GFR calc Af Amer: >60 mL/min (ref >60)
GFR calc non Af Amer: >60 mL/min (ref >60)
Glucose, Bld: 125 mg/dL — ABNORMAL HIGH (ref 65–99)
Potassium: 3 mmol/L — ABNORMAL LOW (ref 3.5–5.1)
Sodium: 136 mmol/L (ref 135–145)

## 2017-11-27 LAB — CBC
HCT: 37.1 % (ref 36.0–46.0)
Hemoglobin: 12.7 g/dL (ref 12.0–15.0)
MCH: 30.4 pg (ref 26.0–34.0)
MCHC: 34.2 g/dL (ref 30.0–36.0)
MCV: 88.8 fL (ref 78.0–100.0)
Platelets: 246 10*3/uL (ref 150–400)
RBC: 4.18 MIL/uL (ref 3.87–5.11)
RDW: 14.4 % (ref 11.5–15.5)
WBC: 9.3 10*3/uL (ref 4.0–10.5)

## 2017-11-27 LAB — URINE CULTURE

## 2017-11-27 LAB — GLUCOSE, CAPILLARY: Glucose-Capillary: 114 mg/dL — ABNORMAL HIGH (ref 65–99)

## 2017-11-27 NOTE — Progress Notes (Signed)
PROGRESS NOTE  Vicki Daniel  UJW:[id number]:[id number] DOB: 08/31/1996 DOA: 11/25/2017 PCP: Patient, No Pcp Per   Brief Narrative: Vicki Daniel is a 21 y.o. female with a history of asthma, marijuana use, and intermittent asthma who presented to the ED 12/15 for intractable abdominal pain, nausea and vomiting. She noted severe, worsening emesis several times per day for the previous 9 days that was initially stomach contents, occasionally bilious, and became streaked with red/brown blood recently. She vomiting >10 times on the day of presentation. She smokes marijuana semi-regluarly and used a friend's vape for THC without improvement. She does not improvement with a hot shower, but nothing else. Symptoms began after taking misoprostol for medical pregnancy termination. HCG has trended downward and pelvic U/S confirms absence of gestational sac, she's had vaginal bleeding which has decreased significantly during this time. She was prescribed flagyl for BV and trichomonas on wet prep and had pyuria noted on UA 12/13 but had been unable to take it due to emesis. No diarrhea, notable sick contacts, suspicious ingestions. Denies other drugs, EtOH abuse, NSAID use, melena, hematochezia. She appeared ill, dehydrated with borderline bradycardia, +UDS for THC and barbiturates, though she denies any use of barbiturates. She was given IV fluids, IV antiemetics, and has failed to tolerate per oral intake thus far.   Assessment & Plan: Principal Problem:   Intractable nausea and vomiting Active Problems:   Acute lower UTI   Hypokalemia   Trichomonas vaginitis   Bacterial vaginosis   Hematemesis   Substance abuse (HCC)  Intractable nausea, vomiting, epigastric pain: With benign abd exam and CT abd/pelvis 12/13. In the absence of other explanations and the characteristic alleviation with hot shower, suspect symptoms primarily driven by cannabinoid hyperemesis.  - Counseled that I suspect THC is primary cause and that  total cessation is treatment going forward. She seems amenable.  - Advancing diet slowly as tolerated with antiemetics as ordered. Appears to at least not becoming worse. Will trial soft diet per pt preference with understanding that liquids would be necessary if she is unable to tolerate this.  Hematemesis: In the setting of intractable vomiting x10 days, strongly suspect Mallory-Weiss tear.  - Continuing empiric PPI for now, again low suspicion for ulcer. - If symptoms do not improve, may need GI input  Normocytic anemia: Acute, suspect more due to vaginal bleeding from POC than hematemesis.  - Monitor, actually resolved. Type and screened.   Dehydration: Unable to tolerate po at this time.  - Continue IV fluids, electrolyte monitoring and correction, and other supportive treatments as above.   Pyuria: Pt has had UTI in the past with dysuria and denies this or any other urinary symptoms. In the absence of fever, etc. doubt this would contribute to symptoms regardless.  - Nonclonal urine culture, discontinue ceftriaxone.   Barbiturate + urine drug screen: Pt seems forthcoming about social history, but denies any knowledge of barbiturate medication/drug use. She was given a hydrocodone pill from her grandmother a few days ago and did smoke a vape that was not hers earlier.  - Counseled to abstain from any possible sources including medications not prescribed for her and any drugs that could potentially contain impurities/alternative substances.   Recent medical abortion: Misoprostol taken 10 days ago with absence of gestational sac confirmed by U/S, HCG appropriately down 6142 > 364.  - Dr. Jolayne Pantheronstant, GYN does not believe this medication is the cause of current presentation.  - This information was not shared with the patient's family per  patient's expressed wishes.   Trichomonas vaginitis and bacterial vaginosis: Dx by wet prep. HIV, GC/Chl recently negative. - Flagyl 500mg  BID x7 days  planned.  Hypokalemia: Continues due poor po intake, emesis.  - Replace in IVF's and recheck.   DVT prophylaxis: SCDs Code Status: Full Family Communication: None at bedside  Disposition Plan: Anticipate DC to home once tolerating po  Consultants:   GYN, Dr. Jolayne Panther by phone by EDP at admission.   Procedures:   None  Antimicrobials:  Ceftriaxone 12/15 - 12/17  Flagyl 12/15 >>   Subjective: Having emesis about 1 hour after trying to take po, so feels she's keeping some down, but has yet to take po without subsequent emesis. Wants to try mashed potatoes and gravy. No hematemesis. Vaginal bleeding resolved. No fever.  Objective: Vitals:   11/26/17 0559 11/26/17 1258 11/26/17 2010 11/27/17 0439  BP: 126/65 123/63 126/69 111/65  Pulse: (!) 54 (!) 59 60 (!) 58  Resp: 20 20 18 18   Temp: 97.8 F (36.6 C) 98.5 F (36.9 C) 99.6 F (37.6 C) 98.3 F (36.8 C)  TempSrc: Oral Oral Oral Oral  SpO2: 100% 99% 100% 99%  Weight:      Height:        Intake/Output Summary (Last 24 hours) at 11/27/2017 1306 Last data filed at 11/26/2017 1837 Gross per 24 hour  Intake 1700.01 ml  Output -  Net 1700.01 ml   Filed Weights   11/26/17 0218  Weight: 77.2 kg (170 lb 4.8 oz)    Gen: Thin female in no distress Pulm: Non-labored breathing room air. Clear to auscultation bilaterally.  CV: Regular rate and rhythm. No murmur, rub, or gallop. No JVD, no pedal edema. GI: Abdomen soft, mild diffuse tenderness remains, nondistended no rebound. Normoactive bowel sounds. No organomegaly or masses felt. Ext: Warm, no deformities Skin: No rashes, lesions no ulcers. No track marks. Neuro: Alert and oriented. No focal neurological deficits. Psych: Judgement and insight appear normal. Mood & affect appropriate.   Data Reviewed: I have personally reviewed following labs and imaging studies  CBC: Recent Labs  Lab 11/24/17 0015 11/25/17 1544 11/26/17 0203 11/26/17 0557 11/27/17 0406  WBC  9.2 11.7* 10.0 9.2 9.3  HGB 12.4 13.1 11.5* 11.8* 12.7  HCT 36.7 38.0 34.2* 34.8* 37.1  MCV 88.2 88.8 90.2 89.0 88.8  PLT 254 260 231 249 246   Basic Metabolic Panel: Recent Labs  Lab 11/23/17 0807 11/24/17 0015 11/25/17 1544 11/26/17 0203 11/26/17 0557 11/27/17 0406  NA 137 134* 136  --  133* 136  K 2.8* 3.0* 3.2*  --  3.4* 3.0*  CL 102 102 102  --  107 107  CO2 24 21* 20*  --  16* 23  GLUCOSE 95 86 80  --  75 125*  BUN 11 5* 10  --  <5* <5*  CREATININE 0.65 0.77 0.66  --  0.51 0.54  CALCIUM 8.6* 8.4* 8.4*  --  7.8* 8.3*  MG  --   --   --  1.7  --   --    GFR: Estimated Creatinine Clearance: 117.2 mL/min (by C-G formula based on SCr of 0.54 mg/dL). Liver Function Tests: Recent Labs  Lab 11/20/17 2105 11/21/17 2014 11/23/17 0807 11/24/17 0015 11/25/17 1544  AST 19 18 21 18 19   ALT 12* 13* 14 12* 12*  ALKPHOS 40  --  39 36* 40  BILITOT 0.6  --  0.9 0.7 1.2  PROT 6.6  --  7.1  6.4* 7.4  ALBUMIN 3.7  --  4.0 3.7 4.2   Recent Labs  Lab 11/23/17 0807 11/24/17 0015 11/25/17 1544  LIPASE 19 20 21    No results for input(s): AMMONIA in the last 168 hours. Coagulation Profile: Recent Labs  Lab 11/26/17 0203  INR 1.23   Cardiac Enzymes: No results for input(s): CKTOTAL, CKMB, CKMBINDEX, TROPONINI in the last 168 hours. BNP (last 3 results) No results for input(s): PROBNP in the last 8760 hours. HbA1C: No results for input(s): HGBA1C in the last 72 hours. CBG: Recent Labs  Lab 11/26/17 0837 11/26/17 0947 11/27/17 0818  GLUCAP 65 157* 114*   Lipid Profile: No results for input(s): CHOL, HDL, LDLCALC, TRIG, CHOLHDL, LDLDIRECT in the last 72 hours. Thyroid Function Tests: No results for input(s): TSH, T4TOTAL, FREET4, T3FREE, THYROIDAB in the last 72 hours. Anemia Panel: No results for input(s): VITAMINB12, FOLATE, FERRITIN, TIBC, IRON, RETICCTPCT in the last 72 hours. Urine analysis:    Component Value Date/Time   COLORURINE YELLOW 11/25/2017 1544    APPEARANCEUR CLEAR 11/25/2017 1544   LABSPEC 1.015 11/25/2017 1544   PHURINE 6.0 11/25/2017 1544   GLUCOSEU NEGATIVE 11/25/2017 1544   HGBUR LARGE (A) 11/25/2017 1544   BILIRUBINUR NEGATIVE 11/25/2017 1544   KETONESUR 80 (A) 11/25/2017 1544   PROTEINUR NEGATIVE 11/25/2017 1544   NITRITE NEGATIVE 11/25/2017 1544   LEUKOCYTESUR NEGATIVE 11/25/2017 1544   Recent Results (from the past 240 hour(s))  Wet prep, genital     Status: Abnormal   Collection Time: 11/18/17  4:29 PM  Result Value Ref Range Status   Yeast Wet Prep HPF POC NONE SEEN NONE SEEN Final   Trich, Wet Prep PRESENT (A) NONE SEEN Final   Clue Cells Wet Prep HPF POC PRESENT (A) NONE SEEN Final   WBC, Wet Prep HPF POC MANY (A) NONE SEEN Final    Comment: MANY BACTERIA SEEN   Sperm NONE SEEN  Final  Urine culture     Status: Abnormal   Collection Time: 11/25/17  5:09 PM  Result Value Ref Range Status   Specimen Description URINE, RANDOM  Final   Special Requests NONE  Final   Culture MULTIPLE SPECIES PRESENT, SUGGEST RECOLLECTION (A)  Final   Report Status 11/27/2017 FINAL  Final      Radiology Studies: No results found.  Scheduled Meds: . metoCLOPramide (REGLAN) injection  5 mg Intravenous Q8H   Continuous Infusions: . dextrose 5 % and 0.9 % NaCl with KCl 20 mEq/L 125 mL/hr at 11/27/17 0845  . metronidazole Stopped (11/27/17 0938)     LOS: 1 day   Time spent: 25 minutes.  Hazeline Junkeryan Grunz, MD [facility] Pager [phone]  If 7PM-7AM, please contact night-coverage [email or web address] Password [email or web address] 11/27/2017, 1:06 PM

## 2017-11-27 NOTE — Progress Notes (Signed)
Initial Nutrition Assessment  INTERVENTION:   Provided brief education on bland foods to try while nauseous Will continue to monitor for additional needs  NUTRITION DIAGNOSIS:   Inadequate oral intake related to nausea, vomiting as evidenced by per patient/family report.  GOAL:   Patient will meet greater than or equal to 90% of their needs  MONITOR:   PO intake, Weight trends, Labs, I & O's, Skin  REASON FOR ASSESSMENT:   Malnutrition Screening Tool   ASSESSMENT:   21 y.o. female with a history of asthma, marijuana use, and intermittent asthma who presented to the ED 12/15 for intractable abdominal pain, nausea and vomiting. She noted severe, worsening emesis several times per day for the previous 9 days that was initially stomach contents, occasionally bilious, and became streaked with red/brown blood recently. She vomiting >10 times on the day of presentation. She smokes marijuana semi-regluarly and used a friend's vape for THC without improvement. She does not improvement with a hot shower, but nothing else. Symptoms began after taking misoprostol for medical pregnancy termination  Pt in room with no family at bedside. Pt reports she has been unable to tolerate any food or drink since 12/6. Per chart review, pt had at that time taken an abortion pill. Pt reports she tried mashed potatoes and applesauce today and she still vomited afterwards. Pt states she is now keeping food down longer but everything does eventually come back up.  Pt on a soft diet. Reviewed bland foods with patient such as bananas, white rice and toast. Encouraged pt to have crackers at bedside at all times. Discouraged drinking liquids with meals. Pt states her grandmother brought her some lavender essential oil to help as well.  Per patient, UBW is 180 lb. States she weighed 167 lb when admitted. She has lost 13 lb since 12/10 (7% wt loss x 1 week, significant for time frame).  Medications: IV Reglan every 8  hours, D5 and .9% NaCl w/ KCl infusion at 125 ml/hr -provides 510 kcal, IV Zofran PRN Labs reviewed: CBGs: 114 Low K Mg WNL  NUTRITION - FOCUSED PHYSICAL EXAM:  Nutrition focused physical exam shows no sign of depletion of muscle mass or body fat.  Diet Order:  DIET SOFT Room service appropriate? Yes; Fluid consistency: Thin  EDUCATION NEEDS:   Education needs have been addressed  Skin:  Skin Assessment: Reviewed RN Assessment  Last BM:  12/9  Height:   Ht Readings from Last 1 Encounters:  11/26/17 5\' 9"  (1.753 m)    Weight:   Wt Readings from Last 1 Encounters:  11/26/17 170 lb 4.8 oz (77.2 kg)    Ideal Body Weight:  65.1 kg  BMI:  Body mass index is 25.15 kg/m.  Estimated Nutritional Needs:   Kcal:  1800-2000  Protein:  65-75g  Fluid:  2L/day  Tilda FrancoLindsey Siren Porrata, MS, RD, LDN [facility] Clinical Dietitian Pager: [phone] After Hours Pager: [phone]

## 2017-11-28 LAB — BASIC METABOLIC PANEL
Anion gap: 5 (ref 5–15)
BUN: <5 mg/dL — ABNORMAL LOW (ref 6–20)
CO2: 23 mmol/L (ref 22–32)
Calcium: 8.1 mg/dL — ABNORMAL LOW (ref 8.9–10.3)
Chloride: 108 mmol/L (ref 101–111)
Creatinine, Ser: 0.53 mg/dL (ref 0.44–1.00)
GFR calc Af Amer: >60 mL/min (ref >60)
GFR calc non Af Amer: >60 mL/min (ref >60)
Glucose, Bld: 121 mg/dL — ABNORMAL HIGH (ref 65–99)
Potassium: 3 mmol/L — ABNORMAL LOW (ref 3.5–5.1)
Sodium: 136 mmol/L (ref 135–145)

## 2017-11-28 MED ORDER — METRONIDAZOLE 500 MG PO TABS: 500.0000 mg | ORAL_TABLET | Freq: Two times a day (BID) | ORAL | 0 refills | Status: DC

## 2017-11-28 MED ORDER — ONDANSETRON 4 MG PO TBDP: 4.0000 mg | ORAL_TABLET | ORAL | 0 refills | Status: DC | PRN

## 2017-11-28 NOTE — Plan of Care (Signed)
Pt able to tolerate small amts of food

## 2017-11-28 NOTE — Discharge Summary (Signed)
Physician Discharge Summary  Vicki Daniel ZOX:[id number] DOB: August 28, 1996 DOA: 11/25/2017  PCP: None  Admit date: 11/25/2017 Discharge date: 11/28/2017  Admitted From: Home Disposition: Home   Recommendations for Outpatient Follow-up:  1. Follow up with/establish care with PCP in 1-2 weeks 2. Please obtain BMP at follow up 3. Ongoing substance cessation counseling recommended. Pt committed to abstinence.   Home Health: None Equipment/Devices: None Discharge Condition: Stable CODE STATUS: Full Diet recommendation: As tolerated (small volume, frequently)  Brief/Interim Summary: Vicki Daniel is a 21 y.o. female with a history of asthma, marijuana use, and intermittent asthma who presented to the ED 12/15 for intractable abdominal pain, nausea and vomiting. She noted severe, worsening emesis several times per day for the previous 9 days that was initially stomach contents, occasionally bilious, and became streaked with red/brown blood recently. She vomiting >10 times on the day of presentation. She smokes marijuana semi-regluarly and used a friend's vape for THC without improvement. She does not improvement with a hot shower, but nothing else. Symptoms began after taking misoprostol for medical pregnancy termination. HCG has trended downward and pelvic U/S confirms absence of gestational sac, she's had vaginal bleeding which has decreased significantly during this time. She was prescribed flagyl for BV and trichomonas on wet prep and had pyuria noted on UA 12/13 but had been unable to take it due to emesis. No diarrhea, notable sick contacts, suspicious ingestions. Denies other drugs, EtOH abuse, NSAID use, melena, hematochezia. She appeared ill, dehydrated with borderline bradycardia, +UDS for THC and barbiturates, though she denies any use of barbiturates. She was given IV fluids, IV antiemetics, and ultimately improved with only these supportive measures.   Discharge Diagnoses:  Principal  Problem:   Intractable nausea and vomiting Active Problems:   Acute lower UTI   Hypokalemia   Trichomonas vaginitis   Bacterial vaginosis   Hematemesis   Substance abuse (HCC)  Cannabinoid hyperemesis: Intractable nausea, vomiting, epigastric pain: With benign abd exam and CT abd/pelvis 12/13. In the absence of other explanations and the characteristic alleviation with hot shower, suspect symptoms primarily driven by cannabinoid hyperemesis.  - Counseled that I suspect THC is primary cause and that total cessation is treatment going forward. She seems amenable.  - Tolerating soft diet, no nausea/vomiting. Will DC with po antiemetic prn.   Hematemesis: In the setting of intractable vomiting x10 days, strongly suspect Mallory-Weiss tear. Resolved.   Normocytic anemia: Acute, suspect more due to vaginal bleeding from POC than hematemesis.  - Monitor, actually resolved without transfusion support, as has vaginal bleeding.   Dehydration: Resolved  Pyuria: Pt has had UTI in the past with dysuria and denies this or any other urinary symptoms. In the absence of fever, etc. doubt this would contribute to symptoms regardless.  - Nonclonal urine culture, discontinued ceftriaxone.   Barbiturate + urine drug screen: Pt seems forthcoming about social history, but denies any knowledge of barbiturate medication/drug use. She was given a hydrocodone pill from her grandmother a few days ago and did smoke a vape that was not hers, ?laced.  - Counseled to abstain from any possible sources including medications not prescribed for her and any drugs that could potentially contain impurities/alternative substances.   Recent medical abortion: Misoprostol taken 10 days ago with absence of gestational sac confirmed by U/S, HCG appropriately downtrending 6142 > 364.  - Dr. Jolayne Panther, GYN does not believe this medication is the cause of current presentation.  - This information was not shared with the patient's  family per patient's expressed wishes.   Trichomonas vaginitis and bacterial vaginosis: Dx by wet prep. HIV, GC/Chl recently negative. - Flagyl 500mg  BID x7 days planned.  Hypokalemia: Improved, will replace today and recheck at follow up.   Discharge Instructions Discharge Instructions    Diet general   Complete by:  As directed    Discharge instructions   Complete by:  As directed    You were admitted for nausea and vomiting which has improved. Since you are tolerating fluids and solids by mouth, you are stable for discharge with the following recommendations:  - Continue taking small frequent meals - Continue taking zofran as needed for nausea - If you are unable to tolerate anything by mouth (your symptoms return), seek medical attention.  - Avoid all substances.   Increase activity slowly   Complete by:  As directed      Allergies as of 11/28/2017   No Known Allergies     Medication List    STOP taking these medications   pantoprazole 40 MG tablet Commonly known as:  PROTONIX     TAKE these medications   metroNIDAZOLE 500 MG tablet Commonly known as:  FLAGYL Take 1 tablet (500 mg total) by mouth 2 (two) times daily.   ondansetron 4 MG disintegrating tablet Commonly known as:  ZOFRAN-ODT Take 1 tablet (4 mg total) by mouth every 4 (four) hours as needed.   promethazine 25 MG suppository Commonly known as:  PHENERGAN Place 1 suppository (25 mg total) rectally every 6 (six) hours as needed for nausea or vomiting.      Follow-up Information    Primary Care Provider Follow up.          No Known Allergies  Consultations:  Gynecology, Dr. Jolayne Pantheronstant, by phone  Procedures/Studies: [place] Ob Comp Less 14 Wks  Result Date: 11/18/2017 CLINICAL DATA:  Pelvic pain and vaginal bleeding EXAM: OBSTETRIC <14 WK US AND TRANSVAGINAL OB US TECHNIQUE: Both transabdominal and transvaginal ultrasound examinations were performed for complete evaluation of the gestation as well  as the maternal uterus, adnexal regions, and pelvic cul-de-sac. Transvaginal technique was performed to assess early pregnancy. COMPARISON:  None. FINDINGS: Intrauterine gestational sac: Not visualized Yolk sac:  Not visualized Embryo:  Not visualized Cardiac Activity: Not visualized Subchorionic hemorrhage:  None visualized. Maternal uterus/adnexae: Uterus measures 8.2 x 4.8 x 4.8 cm. There is no intrauterine mass. Endometrium measures 12 mm with smooth contour. Left ovary measures 2.2 x 1.7 x 1.8 cm. Right ovary measures 3.8 x 1.8 x 2.5 cm. There is no extrauterine pelvic or adnexal mass. No free pelvic fluid. IMPRESSION: No intrauterine gestation is apparent. Assuming positive pregnancy test, differential considerations must include intrauterine gestation too early to be seen by either transabdominal or transvaginal technique; recent spontaneous abortion; ectopic gestation. In this regard, close clinical and laboratory surveillance advised. Timing of repeat imaging will depend largely on beta HCG values going forward. No intrauterine or extrauterine pelvic or adnexal mass. No free pelvic fluid. Endometrium not appreciably thickened. Electronically Signed   By: Bretta BangWilliam  Woodruff III M.D.   On: 11/18/2017 17:28   [place] Ob Transvaginal  Result Date: 11/20/2017 CLINICAL DATA:  Acute onset of vaginal bleeding and pelvic cramping. EXAM: TRANSVAGINAL OB ULTRASOUND TECHNIQUE: Transvaginal ultrasound was performed for complete evaluation of the gestation as well as the maternal uterus, adnexal regions, and pelvic cul-de-sac. COMPARISON:  Pelvic ultrasound performed 11/18/2017 FINDINGS: Intrauterine gestational sac: None seen. Yolk sac:  N/A Embryo:  N/A Subchorionic hemorrhage:  None visualized. Maternal uterus/adnexae: The uterus is unremarkable in appearance The ovaries are within normal limits. The right ovary measures 2.8 x 1.9 x 1.9 cm, while the left ovary measures 2.8 x 2.0 x 2.0 cm. No suspicious adnexal masses  are seen; there is no evidence for ovarian torsion. No free fluid is seen within the pelvic cul-de-sac. IMPRESSION: Unremarkable pelvic ultrasound. No evidence of ectopic pregnancy. No intrauterine gestational sac seen at this time. Electronically Signed   By: Roanna RaiderJeffery  Chang M.D.   On: 11/20/2017 22:25   [place] Ob Transvaginal  Result Date: 11/18/2017 CLINICAL DATA:  Pelvic pain and vaginal bleeding EXAM: OBSTETRIC <14 WK US AND TRANSVAGINAL OB US TECHNIQUE: Both transabdominal and transvaginal ultrasound examinations were performed for complete evaluation of the gestation as well as the maternal uterus, adnexal regions, and pelvic cul-de-sac. Transvaginal technique was performed to assess early pregnancy. COMPARISON:  None. FINDINGS: Intrauterine gestational sac: Not visualized Yolk sac:  Not visualized Embryo:  Not visualized Cardiac Activity: Not visualized Subchorionic hemorrhage:  None visualized. Maternal uterus/adnexae: Uterus measures 8.2 x 4.8 x 4.8 cm. There is no intrauterine mass. Endometrium measures 12 mm with smooth contour. Left ovary measures 2.2 x 1.7 x 1.8 cm. Right ovary measures 3.8 x 1.8 x 2.5 cm. There is no extrauterine pelvic or adnexal mass. No free pelvic fluid. IMPRESSION: No intrauterine gestation is apparent. Assuming positive pregnancy test, differential considerations must include intrauterine gestation too early to be seen by either transabdominal or transvaginal technique; recent spontaneous abortion; ectopic gestation. In this regard, close clinical and laboratory surveillance advised. Timing of repeat imaging will depend largely on beta HCG values going forward. No intrauterine or extrauterine pelvic or adnexal mass. No free pelvic fluid. Endometrium not appreciably thickened. Electronically Signed   By: Bretta BangWilliam  Woodruff III M.D.   On: 11/18/2017 17:28   Ct Abdomen Pelvis W Contrast  Result Date: 11/23/2017 CLINICAL DATA:  Abdominal pain and vomiting EXAM: CT ABDOMEN AND  PELVIS WITH CONTRAST TECHNIQUE: Multidetector CT imaging of the abdomen and pelvis was performed using the standard protocol following bolus administration of intravenous contrast. By report, patient has decreasing beta HCG value, suspicious for recent spontaneous abortion. Patient did sign pregnancy consent form after discussion with the performing technologist and the emergency department physician. CONTRAST:  100 mL Isovue 370 nonionic COMPARISON:  None. FINDINGS: Lower chest: Lung bases are clear. Hepatobiliary: No focal liver lesions are appreciable. Gallbladder wall is not appreciably thickened. There is no biliary duct dilatation. Pancreas: No pancreatic mass or inflammatory focus. Spleen: No splenic lesions are evident. Adrenals/Urinary Tract: Adrenals appear normal bilaterally. Kidneys bilaterally show no evident mass or hydronephrosis on either side. There is no renal or ureteral calculus on either side. Urinary bladder is midline with wall thickness within normal limits. Stomach/Bowel: There is no bowel wall or mesenteric thickening. No evident bowel obstruction. No free air or portal venous air. Vascular/Lymphatic: There is no abdominal aortic aneurysm. No vascular lesions are evident. No adenopathy is appreciable in the abdomen or pelvis. Reproductive: Uterus is anteverted. There is no pelvic mass. There is a small amount of free fluid in the cul-de-sac. Other: Appendix appears normal. No abscess is seen in the an or pelvis. There is no ascites beyond small amount of fluid in the cul de sac. Musculoskeletal: There are no blastic or lytic bone lesions. There is no intramuscular or abdominal wall lesion. IMPRESSION: 1. Small amount of free fluid in cul-de-sac. Question upper normal amount of physiologic fluid versus  recent ovarian cyst rupture. 2.  No pelvic mass evident.  Appendix appears normal. 3.  No bowel obstruction.  No abscess. 4.  No renal or ureteral calculus.  No hydronephrosis. Electronically  Signed   By: Bretta Bang III M.D.   On: 11/23/2017 10:19   Subjective: Very excited that her nausea has subsided, tolerating frequent small meals since last night. Actually ate [employer].   Discharge Exam: Vitals:   11/27/17 2043 11/28/17 0458  BP: 117/71 108/61  Pulse: 85 88  Resp: 18 18  Temp: 98.9 F (37.2 C) 98.1 F (36.7 C)  SpO2: 100% 100%   General: Pt is alert, awake, not in acute distress Cardiovascular: RRR, S1/S2 +, no rubs, no gallops Respiratory: CTA bilaterally, no wheezing, no rhonchi Abdominal: Soft, NT, ND, bowel sounds + Extremities: No edema, no cyanosis  Labs: BNP (last 3 results) No results for input(s): BNP in the last 8760 hours. Basic Metabolic Panel: Recent Labs  Lab 11/24/17 0015 11/25/17 1544 11/26/17 0203 11/26/17 0557 11/27/17 0406 11/28/17 0419  NA 134* 136  --  133* 136 136  K 3.0* 3.2*  --  3.4* 3.0* 3.0*  CL 102 102  --  107 107 108  CO2 21* 20*  --  16* 23 23  GLUCOSE 86 80  --  75 125* 121*  BUN 5* 10  --  <5* <5* <5*  CREATININE 0.77 0.66  --  0.51 0.54 0.53  CALCIUM 8.4* 8.4*  --  7.8* 8.3* 8.1*  MG  --   --  1.7  --   --   --    Liver Function Tests: Recent Labs  Lab 11/21/17 2014 11/23/17 0807 11/24/17 0015 11/25/17 1544  AST 18 21 18 19   ALT 13* 14 12* 12*  ALKPHOS  --  39 36* 40  BILITOT  --  0.9 0.7 1.2  PROT  --  7.1 6.4* 7.4  ALBUMIN  --  4.0 3.7 4.2   Recent Labs  Lab 11/23/17 0807 11/24/17 0015 11/25/17 1544  LIPASE 19 20 21    No results for input(s): AMMONIA in the last 168 hours. CBC: Recent Labs  Lab 11/24/17 0015 11/25/17 1544 11/26/17 0203 11/26/17 0557 11/27/17 0406  WBC 9.2 11.7* 10.0 9.2 9.3  HGB 12.4 13.1 11.5* 11.8* 12.7  HCT 36.7 38.0 34.2* 34.8* 37.1  MCV 88.2 88.8 90.2 89.0 88.8  PLT 254 260 231 249 246   Cardiac Enzymes: No results for input(s): CKTOTAL, CKMB, CKMBINDEX, TROPONINI in the last 168 hours. BNP: Invalid input(s): POCBNP CBG: Recent Labs  Lab  11/26/17 0837 11/26/17 0947 11/27/17 0818  GLUCAP 65 157* 114*   D-Dimer No results for input(s): DDIMER in the last 72 hours. Hgb A1c No results for input(s): HGBA1C in the last 72 hours. Lipid Profile No results for input(s): CHOL, HDL, LDLCALC, TRIG, CHOLHDL, LDLDIRECT in the last 72 hours. Thyroid function studies No results for input(s): TSH, T4TOTAL, T3FREE, THYROIDAB in the last 72 hours.  Invalid input(s): FREET3 Anemia work up No results for input(s): VITAMINB12, FOLATE, FERRITIN, TIBC, IRON, RETICCTPCT in the last 72 hours. Urinalysis    Component Value Date/Time   COLORURINE YELLOW 11/25/2017 1544   APPEARANCEUR CLEAR 11/25/2017 1544   LABSPEC 1.015 11/25/2017 1544   PHURINE 6.0 11/25/2017 1544   GLUCOSEU NEGATIVE 11/25/2017 1544   HGBUR LARGE (A) 11/25/2017 1544   BILIRUBINUR NEGATIVE 11/25/2017 1544   KETONESUR 80 (A) 11/25/2017 1544   PROTEINUR NEGATIVE 11/25/2017 1544  NITRITE NEGATIVE 11/25/2017 1544   LEUKOCYTESUR NEGATIVE 11/25/2017 1544    Microbiology Recent Results (from the past 240 hour(s))  Wet prep, genital     Status: Abnormal   Collection Time: 11/18/17  4:29 PM  Result Value Ref Range Status   Yeast Wet Prep HPF POC NONE SEEN NONE SEEN Final   Trich, Wet Prep PRESENT (A) NONE SEEN Final   Clue Cells Wet Prep HPF POC PRESENT (A) NONE SEEN Final   WBC, Wet Prep HPF POC MANY (A) NONE SEEN Final    Comment: MANY BACTERIA SEEN   Sperm NONE SEEN  Final  Urine culture     Status: Abnormal   Collection Time: 11/25/17  5:09 PM  Result Value Ref Range Status   Specimen Description URINE, RANDOM  Final   Special Requests NONE  Final   Culture MULTIPLE SPECIES PRESENT, SUGGEST RECOLLECTION (A)  Final   Report Status 11/27/2017 FINAL  Final  Culture, blood (Routine X 2) w Reflex to ID Panel     Status: None (Preliminary result)   Collection Time: 11/26/17  2:03 AM  Result Value Ref Range Status   Specimen Description BLOOD LEFT HAND  Final    Special Requests IN PEDIATRIC BOTTLE Blood Culture adequate volume  Final   Culture   Final    NO GROWTH 1 DAY Performed at [facility], [address], [place], [place] [address]    Report Status PENDING  Incomplete  Culture, blood (Routine X 2) w Reflex to ID Panel     Status: None (Preliminary result)   Collection Time: 11/26/17  2:03 AM  Result Value Ref Range Status   Specimen Description BLOOD RIGHT ANTECUBITAL  Final   Special Requests AEROBIC BOTTLE ONLY Blood Culture adequate volume  Final   Culture   Final    NO GROWTH 1 DAY Performed at [facility], [address], [place], [place] [address]    Report Status PENDING  Incomplete    Time coordinating discharge: Approximately 40 minutes  Hazeline Junker, MD  [facility] 11/28/2017, 11:02 AM Pager [phone]

## 2017-11-30 LAB — BPAM RBC
Blood Product Expiration Date: 201901062359
Blood Product Expiration Date: 201901142359
Unit Type and Rh: 600
Unit Type and Rh: 600

## 2017-11-30 LAB — TYPE AND SCREEN
ABO/RH(D): A NEG
Antibody Screen: POSITIVE
Unit division: 0
Unit division: 0

## 2017-12-01 LAB — CULTURE, BLOOD (ROUTINE X 2)
Culture: NO GROWTH
Culture: NO GROWTH
Special Requests: ADEQUATE
Special Requests: ADEQUATE

## 2017-12-13 ENCOUNTER — Other Ambulatory Visit: Payer: Medicaid Other

## 2018-01-30 ENCOUNTER — Ambulatory Visit: Payer: Medicaid Other | Admitting: General Practice

## 2018-01-30 ENCOUNTER — Other Ambulatory Visit (HOSPITAL_COMMUNITY)
Admission: RE | Admit: 2018-01-30 | Discharge: 2018-01-30 | Disposition: A | Discharge status: Home / Self Care | Payer: Medicaid Other | Source: Ambulatory Visit | Attending: Obstetrics & Gynecology | Admitting: Obstetrics & Gynecology

## 2018-01-30 DIAGNOSIS — F1290 Cannabis use, unspecified, uncomplicated: Secondary | ICD-10-CM

## 2018-01-30 DIAGNOSIS — Z113 Encounter for screening for infections with a predominantly sexual mode of transmission: Secondary | ICD-10-CM

## 2018-01-30 DIAGNOSIS — Z202 Contact with and (suspected) exposure to infections with a predominantly sexual mode of transmission: Secondary | ICD-10-CM

## 2018-01-30 LAB — POCT PREGNANCY, URINE: Preg Test, Ur: NEGATIVE

## 2018-01-30 MED ORDER — METRONIDAZOLE 500 MG PO TABS: 2000.0000 mg | ORAL_TABLET | Freq: Once | ORAL | 0 refills | Status: AC

## 2018-01-30 NOTE — Progress Notes (Signed)
Patient here today requesting pregnancy test, std testing due to exposure to trichomonas, as well as requesting a UDS. UPT -. Patient informed. Patient instructed in self swab and specimen collected. Told patient we will contact her with abnormal results. Rx sent for Flagyl due to exposure & patient informed. Informed consent obtained for UDS. Patient had no questions

## 2018-01-31 LAB — HIV ANTIBODY (ROUTINE TESTING W REFLEX): HIV Screen 4th Generation wRfx: NONREACTIVE

## 2018-01-31 LAB — URINE DRUG PANEL 7
Amphetamines, Urine: NEGATIVE ng/mL
Barbiturate Quant, Ur: NEGATIVE ng/mL
Benzodiazepine Quant, Ur: NEGATIVE ng/mL
Cannabinoid Quant, Ur: NEGATIVE ng/mL
Cocaine (Metab.): NEGATIVE ng/mL
Opiate Quant, Ur: NEGATIVE ng/mL
PCP Quant, Ur: NEGATIVE ng/mL

## 2018-01-31 LAB — CERVICOVAGINAL ANCILLARY ONLY
Bacterial vaginitis: NEGATIVE
Candida vaginitis: NEGATIVE
Chlamydia: NEGATIVE
Neisseria Gonorrhea: NEGATIVE
Trichomonas: NEGATIVE

## 2018-01-31 LAB — HEPATITIS B SURFACE ANTIGEN: Hepatitis B Surface Ag: NEGATIVE

## 2018-01-31 LAB — HEPATITIS C ANTIBODY: Hep C Virus Ab: <0.1 s/co ratio (ref 0.0–0.9)

## 2018-01-31 LAB — RPR: RPR Ser Ql: NONREACTIVE

## 2018-02-01 ENCOUNTER — Telehealth: Payer: Self-pay | Admitting: *Deleted

## 2018-02-01 NOTE — Telephone Encounter (Signed)
Kennyth ArnoldStacy called today and left a message requesting results of her tests from 01/30/18.  I called her and gave her all results of std testing- all negative.

## 2018-02-08 ENCOUNTER — Encounter: Payer: Self-pay | Admitting: *Deleted

## 2020-06-25 DIAGNOSIS — Z3009 Encounter for other general counseling and advice on contraception: Secondary | ICD-10-CM | POA: Diagnosis not present

## 2020-06-25 DIAGNOSIS — Z1388 Encounter for screening for disorder due to exposure to contaminants: Secondary | ICD-10-CM | POA: Diagnosis not present

## 2020-06-25 DIAGNOSIS — Z0389 Encounter for observation for other suspected diseases and conditions ruled out: Secondary | ICD-10-CM | POA: Diagnosis not present

## 2020-07-18 ENCOUNTER — Encounter: Payer: Self-pay | Admitting: Emergency Medicine

## 2020-07-18 ENCOUNTER — Other Ambulatory Visit: Payer: Self-pay

## 2020-07-18 ENCOUNTER — Ambulatory Visit
Admission: EM | Admit: 2020-07-18 | Discharge: 2020-07-18 | Disposition: A | Discharge status: Home / Self Care | Payer: Medicaid Other | Attending: Physician Assistant | Admitting: Physician Assistant

## 2020-07-18 DIAGNOSIS — K5900 Constipation, unspecified: Secondary | ICD-10-CM | POA: Insufficient documentation

## 2020-07-18 DIAGNOSIS — N3090 Cystitis, unspecified without hematuria: Secondary | ICD-10-CM | POA: Insufficient documentation

## 2020-07-18 DIAGNOSIS — Z113 Encounter for screening for infections with a predominantly sexual mode of transmission: Secondary | ICD-10-CM | POA: Insufficient documentation

## 2020-07-18 LAB — POCT URINALYSIS DIP (MANUAL ENTRY)
Bilirubin, UA: NEGATIVE
Glucose, UA: NEGATIVE mg/dL
Ketones, POC UA: NEGATIVE mg/dL
Nitrite, UA: NEGATIVE
Protein Ur, POC: 30 mg/dL — AB
Spec Grav, UA: 1.02 (ref 1.010–1.025)
Urobilinogen, UA: 0.2 E.U./dL
pH, UA: 6.5 (ref 5.0–8.0)

## 2020-07-18 MED ORDER — CEPHALEXIN 500 MG PO CAPS: 500.0000 mg | ORAL_CAPSULE | Freq: Two times a day (BID) | ORAL | 0 refills | Status: DC

## 2020-07-18 NOTE — ED Provider Notes (Signed)
[facility]    CSN: [id number] Arrival date & time: 07/18/20  1337      History   Chief Complaint Chief Complaint  Patient presents with  . Exposure to STD    HPI Vicki Daniel is a 24 y.o. female.   24 year old female comes in for 3 day history of low abdominal pain. States also with dysuria, urinary frequency. Denies hematuria. Nausea without vomiting. Low abdominal pain that is intermittent. Constipated. Denies vaginal symptoms. Sexually active 1 female partner, no condom use. Not on birth control. LMP 07/02/2020  Patient states would like trichomonas and BV testing. States has regular STD testing, but they do not test for trich and BV. Last testing had negative GC.      Past Medical History:  Diagnosis Date  . Asthma     Patient Active Problem List   Diagnosis Date Noted  . Substance abuse (HCC) 11/26/2017  . Acute lower UTI 11/25/2017  . Hypokalemia 11/25/2017  . Trichomonas vaginitis 11/25/2017  . Bacterial vaginosis 11/25/2017  . Intractable nausea and vomiting 11/25/2017  . Hematemesis 11/25/2017  . Cannabinoid hyperemesis syndrome     Past Surgical History:  Procedure Laterality Date  . WISDOM TOOTH EXTRACTION      OB History    Gravida  3   Para  0   Term  0   Preterm  0   AB  2   Living  0     SAB  1   TAB  0   Ectopic  0   Multiple  0   Live Births  0            Home Medications    Prior to Admission medications   Medication Sig Start Date End Date Taking? Authorizing Provider  cephALEXin (KEFLEX) 500 MG capsule Take 1 capsule (500 mg total) by mouth 2 (two) times daily. 07/18/20   Belinda Fisher, PA-C  promethazine (PHENERGAN) 25 MG suppository Place 1 suppository (25 mg total) rectally every 6 (six) hours as needed for nausea or vomiting. Patient not taking: Reported on 07/18/2020 11/23/17 07/18/20  Melene Plan, DO    Family History Family History  Problem Relation Age of Onset  . Hypertension Father     Social  History Social History   Tobacco Use  . Smoking status: Former Smoker    Quit date: 04/22/2016    Years since quitting: 4.2  . Smokeless tobacco: Never Used  Vaping Use  . Vaping Use: Former  Substance Use Topics  . Alcohol use: Yes  . Drug use: Yes    Types: Marijuana     Allergies   Patient has no known allergies.   Review of Systems Review of Systems  Reason unable to perform ROS: See HPI as above.     Physical Exam Triage Vital Signs ED Triage Vitals  Enc Vitals Group     BP 07/18/20 1351 121/79     Pulse Rate 07/18/20 1351 75     Resp 07/18/20 1351 16     Temp 07/18/20 1351 98.4 F (36.9 C)     Temp Source 07/18/20 1351 Oral     SpO2 07/18/20 1351 98 %     Weight --      Height --      Head Circumference --      Peak Flow --      Pain Score 07/18/20 1403 7     Pain Loc --  Pain Edu? --      Excl. in GC? --    No data found.  Updated Vital Signs BP 121/79 (BP Location: Left Arm)   Pulse 75   Temp 98.4 F (36.9 C) (Oral)   Resp 16   LMP 07/02/2020   SpO2 98%   Physical Exam Constitutional:      General: She is not in acute distress.    Appearance: She is well-developed. She is not ill-appearing, toxic-appearing or diaphoretic.  HENT:     Head: Normocephalic and atraumatic.  Eyes:     Conjunctiva/sclera: Conjunctivae normal.     Pupils: Pupils are equal, round, and reactive to light.  Cardiovascular:     Rate and Rhythm: Normal rate and regular rhythm.  Pulmonary:     Effort: Pulmonary effort is normal. No respiratory distress.     Comments: LCTAB Abdominal:     General: Bowel sounds are normal.     Palpations: Abdomen is soft.     Tenderness: There is abdominal tenderness in the left upper quadrant and left lower quadrant. There is no right CVA tenderness, left CVA tenderness, guarding or rebound.  Musculoskeletal:     Cervical back: Normal range of motion and neck supple.  Skin:    General: Skin is warm and dry.  Neurological:      Mental Status: She is alert and oriented to person, place, and time.  Psychiatric:        Behavior: Behavior normal.        Judgment: Judgment normal.      UC Treatments / Results  Labs (all labs ordered are listed, but only abnormal results are displayed) Labs Reviewed  POCT URINALYSIS DIP (MANUAL ENTRY) - Abnormal; Notable for the following components:      Result Value   Clarity, UA cloudy (*)    Blood, UA moderate (*)    Protein Ur, POC =30 (*)    Leukocytes, UA Large (3+) (*)    All other components within normal limits  URINE CULTURE  CERVICOVAGINAL ANCILLARY ONLY    EKG   Radiology No results found.  Procedures Procedures (including critical care time)  Medications Ordered in UC Medications - No data to display  Initial Impression / Assessment and Plan / UC Course  I have reviewed the triage vital signs and the nursing notes.  Pertinent labs & imaging results that were available during my care of the patient were reviewed by me and considered in my medical decision making (see chart for details).    Patient declined pregnancy test. Urine dipstick with leuks and blood, will treat for cystitis with keflex. Cytology sent. Discussed treating for constipation due to LLQ/LUQ pain. Push fluids. Return precautions given.  Final Clinical Impressions(s) / UC Diagnoses   Final diagnoses:  Cystitis  Constipation, unspecified constipation type  Screen for STD (sexually transmitted disease)    ED Prescriptions    Medication Sig Dispense Auth. Provider   cephALEXin (KEFLEX) 500 MG capsule Take 1 capsule (500 mg total) by mouth 2 (two) times daily. 10 capsule Belinda Fisher, PA-C     PDMP not reviewed this encounter.   Belinda Fisher, PA-C 07/18/20 1421

## 2020-07-18 NOTE — Discharge Instructions (Signed)
Your urine was positive for an urinary tract infection. Start keflex as directed. Keep hydrated, urine should be clear to pale yellow in color. Cytology sent, you will be contacted with any positive results that requires further treatment. Monitor for any worsening of symptoms, fever, worsening abdominal pain, nausea/vomiting, flank pain, go to the ED for further evaluation.  If continues to be constipated, can start miralax to help with bowel movement.

## 2020-07-18 NOTE — ED Triage Notes (Signed)
Pt here requesting STD testing for lower abd pain; pt sts burning with urination and frequency; pt refusing pregnancy test today or treatment for STDs

## 2020-07-21 LAB — CERVICOVAGINAL ANCILLARY ONLY
Bacterial Vaginitis (gardnerella): NEGATIVE
Comment: NEGATIVE
Comment: NEGATIVE
Trichomonas: NEGATIVE

## 2020-07-22 LAB — URINE CULTURE: Culture: >=100000 — AB

## 2020-12-10 DIAGNOSIS — Z20828 Contact with and (suspected) exposure to other viral communicable diseases: Secondary | ICD-10-CM | POA: Diagnosis not present

## 2020-12-10 DIAGNOSIS — M791 Myalgia, unspecified site: Secondary | ICD-10-CM | POA: Diagnosis not present

## 2020-12-10 DIAGNOSIS — R509 Fever, unspecified: Secondary | ICD-10-CM | POA: Diagnosis not present

## 2020-12-10 DIAGNOSIS — R070 Pain in throat: Secondary | ICD-10-CM | POA: Diagnosis not present

## 2020-12-29 DIAGNOSIS — Z3201 Encounter for pregnancy test, result positive: Secondary | ICD-10-CM | POA: Diagnosis not present

## 2020-12-29 DIAGNOSIS — N911 Secondary amenorrhea: Secondary | ICD-10-CM | POA: Diagnosis not present

## 2020-12-29 DIAGNOSIS — Z113 Encounter for screening for infections with a predominantly sexual mode of transmission: Secondary | ICD-10-CM | POA: Diagnosis not present

## 2021-01-12 DIAGNOSIS — Z2913 Encounter for prophylactic Rho(D) immune globulin: Secondary | ICD-10-CM | POA: Diagnosis not present

## 2021-01-12 DIAGNOSIS — Z3A09 9 weeks gestation of pregnancy: Secondary | ICD-10-CM | POA: Diagnosis not present

## 2021-01-12 DIAGNOSIS — O021 Missed abortion: Secondary | ICD-10-CM | POA: Diagnosis not present

## 2021-01-12 DIAGNOSIS — O26891 Other specified pregnancy related conditions, first trimester: Secondary | ICD-10-CM | POA: Diagnosis not present

## 2021-01-12 DIAGNOSIS — Z6711 Type A blood, Rh negative: Secondary | ICD-10-CM | POA: Diagnosis not present

## 2021-01-12 DIAGNOSIS — Z6791 Unspecified blood type, Rh negative: Secondary | ICD-10-CM | POA: Diagnosis not present

## 2021-01-22 ENCOUNTER — Other Ambulatory Visit: Payer: Self-pay

## 2021-01-22 ENCOUNTER — Encounter (HOSPITAL_COMMUNITY): Payer: Self-pay | Admitting: Obstetrics and Gynecology

## 2021-01-22 ENCOUNTER — Inpatient Hospital Stay (HOSPITAL_COMMUNITY): Payer: Medicaid Other

## 2021-01-22 ENCOUNTER — Inpatient Hospital Stay (HOSPITAL_COMMUNITY)
Admission: AD | Admit: 2021-01-22 | Discharge: 2021-01-22 | Disposition: A | Discharge status: Home / Self Care | Payer: Medicaid Other | Attending: Obstetrics and Gynecology | Admitting: Obstetrics and Gynecology

## 2021-01-22 DIAGNOSIS — Z87891 Personal history of nicotine dependence: Secondary | ICD-10-CM | POA: Diagnosis not present

## 2021-01-22 DIAGNOSIS — O034 Incomplete spontaneous abortion without complication: Secondary | ICD-10-CM | POA: Insufficient documentation

## 2021-01-22 DIAGNOSIS — O209 Hemorrhage in early pregnancy, unspecified: Secondary | ICD-10-CM | POA: Diagnosis not present

## 2021-01-22 DIAGNOSIS — R109 Unspecified abdominal pain: Secondary | ICD-10-CM | POA: Diagnosis present

## 2021-01-22 DIAGNOSIS — Z3A00 Weeks of gestation of pregnancy not specified: Secondary | ICD-10-CM | POA: Diagnosis not present

## 2021-01-22 LAB — CBC WITH DIFFERENTIAL/PLATELET
Abs Immature Granulocytes: 0.05 10*3/uL (ref 0.00–0.07)
Basophils Absolute: 0.1 10*3/uL (ref 0.0–0.1)
Basophils Relative: 0 %
Eosinophils Absolute: 0.1 10*3/uL (ref 0.0–0.5)
Eosinophils Relative: 1 %
HCT: 38.4 % (ref 36.0–46.0)
Hemoglobin: 13 g/dL (ref 12.0–15.0)
Immature Granulocytes: 0 %
Lymphocytes Relative: 16 %
Lymphs Abs: 2 10*3/uL (ref 0.7–4.0)
MCH: 30.8 pg (ref 26.0–34.0)
MCHC: 33.9 g/dL (ref 30.0–36.0)
MCV: 91 fL (ref 80.0–100.0)
Monocytes Absolute: 0.7 10*3/uL (ref 0.1–1.0)
Monocytes Relative: 5 %
Neutro Abs: 10.1 10*3/uL — ABNORMAL HIGH (ref 1.7–7.7)
Neutrophils Relative %: 78 %
Platelets: 298 10*3/uL (ref 150–400)
RBC: 4.22 MIL/uL (ref 3.87–5.11)
RDW: 13.1 % (ref 11.5–15.5)
WBC: 13 10*3/uL — ABNORMAL HIGH (ref 4.0–10.5)
nRBC: 0 % (ref 0.0–0.2)

## 2021-01-22 LAB — COMPREHENSIVE METABOLIC PANEL
ALT: 16 U/L (ref 0–44)
AST: 18 U/L (ref 15–41)
Albumin: 4 g/dL (ref 3.5–5.0)
Alkaline Phosphatase: 42 U/L (ref 38–126)
Anion gap: 9 (ref 5–15)
BUN: 10 mg/dL (ref 6–20)
CO2: 27 mmol/L (ref 22–32)
Calcium: 9.1 mg/dL (ref 8.9–10.3)
Chloride: 105 mmol/L (ref 98–111)
Creatinine, Ser: 0.77 mg/dL (ref 0.44–1.00)
GFR, Estimated: >60 mL/min (ref >60)
Glucose, Bld: 103 mg/dL — ABNORMAL HIGH (ref 70–99)
Potassium: 3.9 mmol/L (ref 3.5–5.1)
Sodium: 141 mmol/L (ref 135–145)
Total Bilirubin: 0.6 mg/dL (ref 0.3–1.2)
Total Protein: 7.2 g/dL (ref 6.5–8.1)

## 2021-01-22 LAB — HCG, QUANTITATIVE, PREGNANCY: hCG, Beta Chain, Quant, S: 137 m[IU]/mL — ABNORMAL HIGH (ref <5)

## 2021-01-22 LAB — URINALYSIS, ROUTINE W REFLEX MICROSCOPIC
Bilirubin Urine: NEGATIVE
Glucose, UA: NEGATIVE mg/dL
Ketones, ur: NEGATIVE mg/dL
Leukocytes,Ua: NEGATIVE
Nitrite: NEGATIVE
Protein, ur: NEGATIVE mg/dL
Specific Gravity, Urine: 1.021 (ref 1.005–1.030)
pH: 7 (ref 5.0–8.0)

## 2021-01-22 LAB — POCT PREGNANCY, URINE: Preg Test, Ur: POSITIVE — AB

## 2021-01-22 NOTE — MAU Note (Signed)
Vicki Daniel is a 25 y.o. at [redacted]w[redacted]d here in MAU reporting: Was told on 02/1 that she had miscarriage and was offered medication and DC. Chose to do medication but started bleeding heavily on 2/3 so she has not taken it. Now is noticing an odor and is worried about sepsis, bleeding has slowed down but is having abdominal pain.   Onset of complaint: ongoing  Pain score: 8/10  Vitals:   01/22/21 1125  BP: 137/81  Pulse: (!) 104  Resp: 16  Temp: 99.1 F (37.3 C)  SpO2: 98%     Lab orders placed from triage: UA, UPT

## 2021-01-22 NOTE — MAU Provider Note (Signed)
History     CSN: [id number]  Arrival date and time: 01/22/21 1051   Event Date/Time   First Provider Initiated Contact with Patient 01/22/21 1140      Chief Complaint  Patient presents with  . Abdominal Pain   HPI Vicki Daniel is a 25 y.o. [phone] who presents to MAU with chief complaint of abdominal pain and concern for "sepsis". She is s/p diagnosis of Missed Ab in the [facility] OB office on 01/12/2021. Patient states she was prescribed Cytotec but was hesitant to take it due to previous "really bad effects".  She reports new onset vaginal bleeding on 01/15/2021. Her bleeding involved multiple large clots for the first few days. Her heavy bleeding gradually resolved and she now endorses irregular spotting.  Patient also c/o lower abdominal pain and abnormal vaginal discharge. Pain score 8/10. These are new problems, onset coinciding with onset of vaginal bleeding. She was prescribed pain medication with the Cytotec but states she has run out of pills. Pain waxes and wanes with movement. She denies alleviating factors.   Patient states she has planned followup with [facility] on Monday. Dr Reina Fuse is her primary.  OB History    Gravida  4   Para  0   Term  0   Preterm  0   AB  2   Living  0     SAB  1   IAB  0   Ectopic  0   Multiple  0   Live Births  0           Past Medical History:  Diagnosis Date  . Asthma     Past Surgical History:  Procedure Laterality Date  . WISDOM TOOTH EXTRACTION      Family History  Problem Relation Age of Onset  . Hypertension Father     Social History   Tobacco Use  . Smoking status: Former Smoker    Quit date: 04/22/2016    Years since quitting: 4.7  . Smokeless tobacco: Never Used  Vaping Use  . Vaping Use: Former  Substance Use Topics  . Alcohol use: Yes  . Drug use: Yes    Types: Marijuana    Allergies: No Known Allergies  Medications Prior to Admission  Medication Sig Dispense Refill Last Dose   . cephALEXin (KEFLEX) 500 MG capsule Take 1 capsule (500 mg total) by mouth 2 (two) times daily. 10 capsule 0     Review of Systems  Constitutional: Positive for fatigue. Negative for fever.  Respiratory: Negative for shortness of breath.   Gastrointestinal: Positive for abdominal pain.  Genitourinary: Positive for vaginal bleeding.  All other systems reviewed and are negative.  Physical Exam   Blood pressure 137/81, pulse (!) 104, temperature 99.1 F (37.3 C), temperature source Oral, resp. rate 16, height 5\' 9"  (1.753 m), weight 92.2 kg, last menstrual period 11/08/2020, SpO2 98 %, unknown if currently breastfeeding.  Physical Exam Vitals and nursing note reviewed. Exam conducted with a chaperone present.  Constitutional:      Appearance: She is well-developed.  Abdominal:     Palpations: Abdomen is soft.     Tenderness: There is no abdominal tenderness. There is no right CVA tenderness or left CVA tenderness.  Skin:    Capillary Refill: Capillary refill takes less than 2 seconds.  Neurological:     Mental Status: She is alert and oriented to person, place, and time.     MAU Course  Procedures   --Pertinent negatives:  abdominal tenderness on CNM exam, dysuria, fever  Orders Placed This Encounter  Procedures  . US OB Transvaginal    Standing Status:   Standing    Number of Occurrences:   1    Order Specific Question:   Reason for Exam (SYMPTOM  OR DIAGNOSIS REQUIRED)    Answer:   s/p miscarriage, concerned for retained POC  . Urinalysis, Routine w reflex microscopic Urine, Clean Catch    Standing Status:   Standing    Number of Occurrences:   1  . CBC with Differential/Platelet    Standing Status:   Standing    Number of Occurrences:   1  . Comprehensive metabolic panel    Standing Status:   Standing    Number of Occurrences:   1  . hCG, quantitative, pregnancy    Standing Status:   Standing    Number of Occurrences:   1  . Pregnancy, urine POC    Standing  Status:   Standing    Number of Occurrences:   1   Patient Vitals for the past 24 hrs:  BP Temp Temp src Pulse Resp SpO2 Height Weight  01/22/21 1125 137/81 99.1 F (37.3 C) Oral (!) 104 16 98 % -- --  01/22/21 1120 -- -- -- -- -- -- 5\' 9"  (1.753 m) 92.2 kg   Results for orders placed or performed during the hospital encounter of 01/22/21 (from the past 24 hour(s))  Pregnancy, urine POC     Status: Abnormal   Collection Time: 01/22/21 11:17 AM  Result Value Ref Range   Preg Test, Ur POSITIVE (A) NEGATIVE  Urinalysis, Routine w reflex microscopic Urine, Clean Catch     Status: Abnormal   Collection Time: 01/22/21 11:19 AM  Result Value Ref Range   Color, Urine YELLOW YELLOW   APPearance CLEAR CLEAR   Specific Gravity, Urine 1.021 1.005 - 1.030   pH 7.0 5.0 - 8.0   Glucose, UA NEGATIVE NEGATIVE mg/dL   Hgb urine dipstick MODERATE (A) NEGATIVE   Bilirubin Urine NEGATIVE NEGATIVE   Ketones, ur NEGATIVE NEGATIVE mg/dL   Protein, ur NEGATIVE NEGATIVE mg/dL   Nitrite NEGATIVE NEGATIVE   Leukocytes,Ua NEGATIVE NEGATIVE   RBC / HPF 0-5 0 - 5 RBC/hpf   WBC, UA 0-5 0 - 5 WBC/hpf   Bacteria, UA RARE (A) NONE SEEN   Squamous Epithelial / LPF 0-5 0 - 5   Mucus PRESENT   CBC with Differential/Platelet     Status: Abnormal   Collection Time: 01/22/21 11:46 AM  Result Value Ref Range   WBC 13.0 (H) 4.0 - 10.5 K/uL   RBC 4.22 3.87 - 5.11 MIL/uL   Hemoglobin 13.0 12.0 - 15.0 g/dL   HCT 03/22/21 28.7 - 86.7 %   MCV 91.0 80.0 - 100.0 fL   MCH 30.8 26.0 - 34.0 pg   MCHC 33.9 30.0 - 36.0 g/dL   RDW 67.2 09.4 - 70.9 %   Platelets 298 150 - 400 K/uL   nRBC 0.0 0.0 - 0.2 %   Neutrophils Relative % 78 %   Neutro Abs 10.1 (H) 1.7 - 7.7 K/uL   Lymphocytes Relative 16 %   Lymphs Abs 2.0 0.7 - 4.0 K/uL   Monocytes Relative 5 %   Monocytes Absolute 0.7 0.1 - 1.0 K/uL   Eosinophils Relative 1 %   Eosinophils Absolute 0.1 0.0 - 0.5 K/uL   Basophils Relative 0 %   Basophils Absolute 0.1 0.0 - 0.1  K/uL   Immature Granulocytes 0 %   Abs Immature Granulocytes 0.05 0.00 - 0.07 K/uL  Comprehensive metabolic panel     Status: Abnormal   Collection Time: 01/22/21 11:46 AM  Result Value Ref Range   Sodium 141 135 - 145 mmol/L   Potassium 3.9 3.5 - 5.1 mmol/L   Chloride 105 98 - 111 mmol/L   CO2 27 22 - 32 mmol/L   Glucose, Bld 103 (H) 70 - 99 mg/dL   BUN 10 6 - 20 mg/dL   Creatinine, Ser 1.82 0.44 - 1.00 mg/dL   Calcium 9.1 8.9 - 99.3 mg/dL   Total Protein 7.2 6.5 - 8.1 g/dL   Albumin 4.0 3.5 - 5.0 g/dL   AST 18 15 - 41 U/L   ALT 16 0 - 44 U/L   Alkaline Phosphatase 42 38 - 126 U/L   Total Bilirubin 0.6 0.3 - 1.2 mg/dL   GFR, Estimated >71 >69 mL/min   Anion gap 9 5 - 15   US OB Transvaginal  Result Date: 01/22/2021 CLINICAL DATA:  Status post spontaneous abortion, persistent vaginal bleeding EXAM: TRANSVAGINAL OB ULTRASOUND TECHNIQUE: Transvaginal ultrasound was performed for complete evaluation of the gestation as well as the maternal uterus, adnexal regions, and pelvic cul-de-sac. COMPARISON:  None. FINDINGS: Intrauterine gestational sac: None identified Maternal uterus/adnexae: The uterus is anteverted. The uterus measures roughly 4.6 x 4.0 x 9.1 cm in greatest dimension. No intrauterine masses are seen. Within the endometrial cavity, there is a moderate amount of heterogeneous echogenic material within the lower uterine segment demonstrating internal vascularity most in keeping with retained products of conception. The material a tears to extend into the endocervical canal. The cervix appears closed however. There is no free fluid within the cul-de-sac. The maternal ovaries are normal in size and echogenicity. IMPRESSION: No intrauterine gestational sac identified in keeping with given history of spontaneous abortion. Moderate echogenic vascularized endocavitary material within the lower uterine segment extending into the endocervical canal most in keeping with retained products of  conception. Electronically Signed   By: Helyn Numbers MD   On: 01/22/2021 12:41   Assessment and Plan  --25 y.o. G4P0020 s/p missed ab --Retained POC identified on ultrasound today --Dr. Reina Fuse in unit to delineate care plan --Patient to take previously prescribed Cytotec --Discharge home in stable condition  Calvert Cantor, CNM 01/22/2021, 3:00 PM

## 2021-01-22 NOTE — Discharge Instructions (Signed)
Miscarriage A miscarriage is the loss of a pregnancy before the 20th week of pregnancy. Sometimes, a pregnancy ends before a woman knows that she is pregnant. If you lose a pregnancy, talk with your doctor about:  Questions you have about the loss of your baby.  How to work through your grief.  Plans for future pregnancy. What are the causes? Many times, the cause of this condition is not known. What increases the risk? These things may make a pregnant woman more likely to lose a pregnancy: Certain health conditions  Conditions that affect hormones, such as: ? Thyroid disease. ? Polycystic ovary syndrome.  Diabetes.  A disease that causes the body's disease-fighting system to attack itself by mistake.  Infections.  Bleeding problems.  Being very overweight. Lifestyle factors  Using products that have tobacco or nicotine in them.  Being around tobacco smoke.  Having alcohol.  Having a lot of caffeine.  Using drugs. Problems with reproductive organs or parts  Having a cervix that opens and thins before your due date. The cervix is the lowest part of your womb.  Having Asherman syndrome, which leads to: ? Scars in the womb. ? The womb being abnormal in shape.  Growths (fibroids) in the womb.  Problems in the body that are present at birth.  Infection of the cervix or womb. Personal or health history  Injury.  Having lost a pregnancy before.  Being younger than age 18 or older than age 35.  Being around a harmful substance, such as radiation.  Having lead or other heavy metals in: ? Things you eat or drink. ? The air around you.  Using certain medicines. What are the signs or symptoms?  Blood or spots of blood coming from the vagina. You may also have cramps or pain.  Pain or cramps in the belly or low back.  Fluid or tissue coming out of the vagina. How is this treated? Sometimes, treatment is not needed. If you need treatment, you may be  treated with:  A procedure to open the cervix more and take tissue out of the womb.  Medicines. You may get a shot of medicine called Rho(D) immune globulin. Follow these instructions at home: Medicines  Take over-the-counter and prescription medicines only as told by your doctor.  If you were prescribed antibiotic medicine, take it as told by your doctor. Do not stop taking it even if you start to feel better. Activity  Rest as told by your doctor. Ask your doctor what activities are safe for you.  Have someone help you at home during this time. General instructions  Watch how much tissue comes out of the vagina.  Watch the size of any blood clots that come out of the vagina.  Do not have sex or douche until your doctor says it is okay.  Do not put things, such as tampons, in your vagina until your doctor says it is okay.  To help you and your partner with grieving: ? Talk with your doctor. ? See a counselor.  When you are ready, talk with your doctor about: ? Things to do for your health. ? How you can be healthy if you get pregnant again.  Keep all follow-up visits.   Where to find more information  The American College of Obstetricians and Gynecologists: acog.org  [place] Department of Health and Human Services Office of Women's Health: hrsa.gov/office-womens-health Contact a doctor if:  You have a fever or chills.  There is bad-smelling fluid coming   from the vagina.  You have more bleeding.  Tissue or clots of blood come out of your vagina. Get help right away if:  You have very bad cramps or pain in your back or belly.  You soak more than 2 large pads in an hour for more than 2 hours.  You get light-headed or weak.  You faint.  You feel sad, and you have sad thoughts a lot of the time.  You think about hurting yourself. Get help right awayif you feel like you may hurt yourself or others, or have thoughts about taking your own life. Go to your nearest  emergency room or:  Call your local emergency services (911 in the [place]  Call the National Suicide Prevention Lifeline at 1-800-273-8255. This is open 24 hours a day.  Text the Crisis Text Line at 741741. Summary  A miscarriage is the loss of a pregnancy before the 20th week of pregnancy. Sometimes, a pregnancy ends before a woman knows that she is pregnant.  Follow instructions from your doctor about medicines and activity.  To help you and your partner with grieving, talk with your doctor or a counselor.  Keep all follow-up visits. This information is not intended to replace advice given to you by your health care provider. Make sure you discuss any questions you have with your health care provider. Document Revised: 05/29/2020 Document Reviewed: 05/29/2020 Elsevier Patient Education  2021 Elsevier Inc.  

## 2021-02-04 DIAGNOSIS — R309 Painful micturition, unspecified: Secondary | ICD-10-CM | POA: Diagnosis not present

## 2021-02-04 DIAGNOSIS — Z8759 Personal history of other complications of pregnancy, childbirth and the puerperium: Secondary | ICD-10-CM | POA: Diagnosis not present

## 2021-06-03 DIAGNOSIS — Z113 Encounter for screening for infections with a predominantly sexual mode of transmission: Secondary | ICD-10-CM | POA: Diagnosis not present

## 2021-06-03 DIAGNOSIS — N924 Excessive bleeding in the premenopausal period: Secondary | ICD-10-CM | POA: Diagnosis not present

## 2021-06-03 DIAGNOSIS — Z3202 Encounter for pregnancy test, result negative: Secondary | ICD-10-CM | POA: Diagnosis not present

## 2021-07-29 DIAGNOSIS — Z113 Encounter for screening for infections with a predominantly sexual mode of transmission: Secondary | ICD-10-CM | POA: Diagnosis not present

## 2021-07-29 DIAGNOSIS — Z3202 Encounter for pregnancy test, result negative: Secondary | ICD-10-CM | POA: Diagnosis not present

## 2021-07-29 DIAGNOSIS — N898 Other specified noninflammatory disorders of vagina: Secondary | ICD-10-CM | POA: Diagnosis not present

## 2021-07-29 DIAGNOSIS — N760 Acute vaginitis: Secondary | ICD-10-CM | POA: Diagnosis not present

## 2021-08-31 DIAGNOSIS — Z0389 Encounter for observation for other suspected diseases and conditions ruled out: Secondary | ICD-10-CM | POA: Diagnosis not present

## 2021-10-12 DIAGNOSIS — Z113 Encounter for screening for infections with a predominantly sexual mode of transmission: Secondary | ICD-10-CM | POA: Diagnosis not present

## 2021-10-12 DIAGNOSIS — Z3202 Encounter for pregnancy test, result negative: Secondary | ICD-10-CM | POA: Diagnosis not present

## 2021-10-12 DIAGNOSIS — N898 Other specified noninflammatory disorders of vagina: Secondary | ICD-10-CM | POA: Diagnosis not present

## 2021-10-12 DIAGNOSIS — R208 Other disturbances of skin sensation: Secondary | ICD-10-CM | POA: Diagnosis not present

## 2021-10-30 ENCOUNTER — Inpatient Hospital Stay (HOSPITAL_COMMUNITY)
Admission: AD | Admit: 2021-10-30 | Discharge: 2021-10-30 | Disposition: A | Discharge status: Home / Self Care | Payer: Medicaid Other | Attending: Obstetrics and Gynecology | Admitting: Obstetrics and Gynecology

## 2021-10-30 ENCOUNTER — Encounter (HOSPITAL_COMMUNITY): Payer: Self-pay | Admitting: Obstetrics and Gynecology

## 2021-10-30 ENCOUNTER — Other Ambulatory Visit: Payer: Self-pay

## 2021-10-30 DIAGNOSIS — R300 Dysuria: Secondary | ICD-10-CM | POA: Diagnosis not present

## 2021-10-30 DIAGNOSIS — O26891 Other specified pregnancy related conditions, first trimester: Secondary | ICD-10-CM | POA: Insufficient documentation

## 2021-10-30 DIAGNOSIS — Z3A01 Less than 8 weeks gestation of pregnancy: Secondary | ICD-10-CM | POA: Insufficient documentation

## 2021-10-30 DIAGNOSIS — Z3201 Encounter for pregnancy test, result positive: Secondary | ICD-10-CM | POA: Insufficient documentation

## 2021-10-30 LAB — URINALYSIS, ROUTINE W REFLEX MICROSCOPIC
Bilirubin Urine: NEGATIVE
Glucose, UA: NEGATIVE mg/dL
Hgb urine dipstick: NEGATIVE
Ketones, ur: NEGATIVE mg/dL
Nitrite: NEGATIVE
Protein, ur: NEGATIVE mg/dL
Specific Gravity, Urine: 1.012 (ref 1.005–1.030)
pH: 6 (ref 5.0–8.0)

## 2021-10-30 LAB — WET PREP, GENITAL
Clue Cells Wet Prep HPF POC: NONE SEEN
Sperm: NONE SEEN
Trich, Wet Prep: NONE SEEN
WBC, Wet Prep HPF POC: <10 (ref <10)
Yeast Wet Prep HPF POC: NONE SEEN

## 2021-10-30 LAB — POCT PREGNANCY, URINE: Preg Test, Ur: POSITIVE — AB

## 2021-10-30 NOTE — MAU Provider Note (Signed)
Event Date/Time   First Provider Initiated Contact with Patient 10/30/21 1942      S Ms. Vicki Daniel is a 25 y.o. [id number] patient who presents to MAU today with complaint of dysuria and possible pregnancy. She reports 7 positive home pregnancy tests. She denies any pain or bleeding. She reports it burns when she urinates.   O BP 119/68 (BP Location: Right Arm)   Pulse (!) 107   Temp 98.9 F (37.2 C) (Oral)   Resp 18   Ht 5\' 9"  (1.753 m)   Wt 97.1 kg   LMP 09/28/2021   SpO2 100%   Breastfeeding Unknown   BMI 31.60 kg/m  Physical Exam Vitals and nursing note reviewed.  Constitutional:      General: She is not in acute distress.    Appearance: She is well-developed.  HENT:     Head: Normocephalic.  Eyes:     Pupils: Pupils are equal, round, and reactive to light.  Cardiovascular:     Rate and Rhythm: Normal rate and regular rhythm.     Heart sounds: Normal heart sounds.  Pulmonary:     Effort: Pulmonary effort is normal. No respiratory distress.     Breath sounds: Normal breath sounds.  Abdominal:     General: Bowel sounds are normal. There is no distension.     Palpations: Abdomen is soft.     Tenderness: There is no abdominal tenderness.  Skin:    General: Skin is warm and dry.  Neurological:     Mental Status: She is alert and oriented to person, place, and time.  Psychiatric:        Mood and Affect: Mood normal.        Behavior: Behavior normal.        Thought Content: Thought content normal.        Judgment: Judgment normal.    A Medical screening exam complete 1. Dysuria during pregnancy in first trimester   2. [redacted] weeks gestation of pregnancy    -Patient desires to wait for urine culture before treating  P Discharge from MAU in stable condition Patient given the option of transfer to [facility] for further evaluation or seek care in outpatient facility of choice  List of options for follow-up given  Warning signs for worsening condition that would warrant  emergency follow-up discussed Patient may return to MAU as needed   [facility], Rolm Bookbinder 10/30/2021 7:42 PM

## 2021-10-30 NOTE — Discharge Instructions (Signed)

## 2021-10-30 NOTE — MAU Note (Signed)
For the past 3 wks, has been having burning from the urethra.  Was treated for BV and yeast a few days before the onset, thinks she still has a yeast infection.  Denies itching. Thinks she is pregnant.  + HPT 5 days ago, again today.  (Seven + tests). No bleeding.

## 2021-11-01 LAB — GC/CHLAMYDIA PROBE AMP (~~LOC~~) NOT AT ARMC
Chlamydia: NEGATIVE
Comment: NEGATIVE
Comment: NORMAL
Neisseria Gonorrhea: NEGATIVE

## 2021-11-02 ENCOUNTER — Other Ambulatory Visit: Payer: Self-pay

## 2021-11-02 ENCOUNTER — Ambulatory Visit
Admission: RE | Admit: 2021-11-02 | Discharge: 2021-11-02 | Disposition: A | Discharge status: Home / Self Care | Payer: Medicaid Other | Source: Ambulatory Visit

## 2021-11-02 ENCOUNTER — Ambulatory Visit (INDEPENDENT_AMBULATORY_CARE_PROVIDER_SITE_OTHER): Payer: Medicaid Other | Admitting: *Deleted

## 2021-11-02 VITALS — BP 108/73 | HR 92

## 2021-11-02 DIAGNOSIS — Z3A01 Less than 8 weeks gestation of pregnancy: Secondary | ICD-10-CM | POA: Insufficient documentation

## 2021-11-02 DIAGNOSIS — O3680X0 Pregnancy with inconclusive fetal viability, not applicable or unspecified: Secondary | ICD-10-CM

## 2021-11-02 DIAGNOSIS — O26891 Other specified pregnancy related conditions, first trimester: Secondary | ICD-10-CM | POA: Insufficient documentation

## 2021-11-02 DIAGNOSIS — R300 Dysuria: Secondary | ICD-10-CM

## 2021-11-02 DIAGNOSIS — O26841 Uterine size-date discrepancy, first trimester: Secondary | ICD-10-CM | POA: Diagnosis not present

## 2021-11-02 DIAGNOSIS — N83202 Unspecified ovarian cyst, left side: Secondary | ICD-10-CM | POA: Diagnosis not present

## 2021-11-02 DIAGNOSIS — Z369 Encounter for antenatal screening, unspecified: Secondary | ICD-10-CM | POA: Diagnosis not present

## 2021-11-02 LAB — POCT URINALYSIS DIP (DEVICE)
Bilirubin Urine: NEGATIVE
Glucose, UA: NEGATIVE mg/dL
Hgb urine dipstick: NEGATIVE
Ketones, ur: NEGATIVE mg/dL
Nitrite: NEGATIVE
Protein, ur: NEGATIVE mg/dL
Specific Gravity, Urine: 1.02 (ref 1.005–1.030)
Urobilinogen, UA: 0.2 mg/dL (ref 0.0–1.0)
pH: 6 (ref 5.0–8.0)

## 2021-11-02 NOTE — Progress Notes (Signed)
Patient here for [place] results.  Reviewed [place] results with Leticia Penna, DO and informed patient [place] results indicate pregnancy may be too early to confirm pregnancy. Informed her our provider recommends [place] in 2 weeks. Ectopic precautions given. She denies bleeding or pain. C/o mild cramping sometimes. Plans to go to Dr. Reina Fuse but states they wont do [place] until 12/03/21. [place] scheduled for 11/16/21.  She states she still has burning , not necessarily with urination. She denies pain with urination , frequency or urgency. UA in MAU showed large leukocytes. She states she thought they ordered an overnight test. Per chart no culture ordered. Obtained UA today which  was negative.   Advised to follow up with her OB/GYN that she has scheduled pregnancy confirmation appointment with. ADvised MAU if severe pain or vaginal bleeding. She voices understanding. Alinna Siple,RN

## 2021-11-02 NOTE — Progress Notes (Signed)
Patient was evaluated by nursing staff. Agree with assessment and plan.  °

## 2021-11-09 ENCOUNTER — Encounter (HOSPITAL_COMMUNITY): Payer: Self-pay | Admitting: Obstetrics and Gynecology

## 2021-11-09 ENCOUNTER — Other Ambulatory Visit: Payer: Self-pay

## 2021-11-09 ENCOUNTER — Inpatient Hospital Stay (HOSPITAL_COMMUNITY)
Admission: AD | Admit: 2021-11-09 | Discharge: 2021-11-09 | Disposition: A | Discharge status: Home / Self Care | Payer: Medicaid Other | Attending: Obstetrics and Gynecology | Admitting: Obstetrics and Gynecology

## 2021-11-09 ENCOUNTER — Inpatient Hospital Stay (HOSPITAL_COMMUNITY): Payer: Medicaid Other

## 2021-11-09 DIAGNOSIS — R1032 Left lower quadrant pain: Secondary | ICD-10-CM | POA: Insufficient documentation

## 2021-11-09 DIAGNOSIS — M545 Low back pain, unspecified: Secondary | ICD-10-CM | POA: Insufficient documentation

## 2021-11-09 DIAGNOSIS — O2341 Unspecified infection of urinary tract in pregnancy, first trimester: Secondary | ICD-10-CM | POA: Insufficient documentation

## 2021-11-09 DIAGNOSIS — O3680X0 Pregnancy with inconclusive fetal viability, not applicable or unspecified: Secondary | ICD-10-CM

## 2021-11-09 DIAGNOSIS — O26891 Other specified pregnancy related conditions, first trimester: Secondary | ICD-10-CM | POA: Diagnosis not present

## 2021-11-09 DIAGNOSIS — Z3A01 Less than 8 weeks gestation of pregnancy: Secondary | ICD-10-CM | POA: Diagnosis not present

## 2021-11-09 DIAGNOSIS — N83202 Unspecified ovarian cyst, left side: Secondary | ICD-10-CM | POA: Diagnosis not present

## 2021-11-09 LAB — URINALYSIS, ROUTINE W REFLEX MICROSCOPIC
Bilirubin Urine: NEGATIVE
Glucose, UA: NEGATIVE mg/dL
Ketones, ur: NEGATIVE mg/dL
Nitrite: POSITIVE — AB
Protein, ur: 100 mg/dL — AB
Specific Gravity, Urine: 1.02 (ref 1.005–1.030)
pH: 6 (ref 5.0–8.0)

## 2021-11-09 LAB — URINALYSIS, MICROSCOPIC (REFLEX)
RBC / HPF: >50 RBC/hpf (ref 0–5)
WBC, UA: >50 WBC/hpf (ref 0–5)

## 2021-11-09 LAB — COMPREHENSIVE METABOLIC PANEL
ALT: 21 U/L (ref 0–44)
AST: 20 U/L (ref 15–41)
Albumin: 3.7 g/dL (ref 3.5–5.0)
Alkaline Phosphatase: 38 U/L (ref 38–126)
Anion gap: 6 (ref 5–15)
BUN: 7 mg/dL (ref 6–20)
CO2: 25 mmol/L (ref 22–32)
Calcium: 8.7 mg/dL — ABNORMAL LOW (ref 8.9–10.3)
Chloride: 103 mmol/L (ref 98–111)
Creatinine, Ser: 0.68 mg/dL (ref 0.44–1.00)
GFR, Estimated: >60 mL/min (ref >60)
Glucose, Bld: 98 mg/dL (ref 70–99)
Potassium: 3.4 mmol/L — ABNORMAL LOW (ref 3.5–5.1)
Sodium: 134 mmol/L — ABNORMAL LOW (ref 135–145)
Total Bilirubin: 0.7 mg/dL (ref 0.3–1.2)
Total Protein: 6.8 g/dL (ref 6.5–8.1)

## 2021-11-09 LAB — CBC
HCT: 37.9 % (ref 36.0–46.0)
Hemoglobin: 12.9 g/dL (ref 12.0–15.0)
MCH: 31.4 pg (ref 26.0–34.0)
MCHC: 34 g/dL (ref 30.0–36.0)
MCV: 92.2 fL (ref 80.0–100.0)
Platelets: 245 10*3/uL (ref 150–400)
RBC: 4.11 MIL/uL (ref 3.87–5.11)
RDW: 13 % (ref 11.5–15.5)
WBC: 11 10*3/uL — ABNORMAL HIGH (ref 4.0–10.5)
nRBC: 0 % (ref 0.0–0.2)

## 2021-11-09 LAB — HCG, QUANTITATIVE, PREGNANCY: hCG, Beta Chain, Quant, S: 15679 m[IU]/mL — ABNORMAL HIGH (ref <5)

## 2021-11-09 MED ORDER — ACETAMINOPHEN 500 MG PO TABS
1000.0000 mg | ORAL_TABLET | Freq: Once | ORAL | Status: AC
  Administered 2021-11-09: 1000 mg via ORAL

## 2021-11-09 MED ORDER — CEFADROXIL 500 MG PO CAPS: 500.0000 mg | ORAL_CAPSULE | Freq: Two times a day (BID) | ORAL | 0 refills | Status: DC

## 2021-11-09 MED ORDER — ACETAMINOPHEN 325 MG PO TABS: 650.0000 mg | ORAL_TABLET | ORAL | 0 refills | Status: AC | PRN

## 2021-11-09 NOTE — MAU Provider Note (Signed)
History     CSN: [id number]  Arrival date and time: 11/09/21 1248  Event Date/Time  First Provider Initiated Contact with Patient 11/09/21 1337     Chief Complaint  Patient presents with   Abdominal Pain   HPI Vicki Daniel is a 25 y.o. G4P0030 at [redacted]w[redacted]d by LMP with pregnancy of unknown location. She presents to MAU with chief complaint of left mid and lower quadrant abdominal pain. This is a new problem, onset two days ago. The locus of patient's pain is her left lower back. Pain score at the origin point is 8/10. Pain radiates to her left rib cage, left mid-abdomen and left lower quadrant of her abdomen. Pain score at sites of radiation is 6-7/10. Pain score improves when she lies in left lateral. She has not taken medication or tried other treatments for this pain "I'm scared to hurt the baby". She denies dysuria, urgency, fever.  Patient states she had a dizzy episode while at work about one hour ago. Symptoms improved with rest. She denies vomiting, weakness, syncope.   OB History     Gravida  4   Para  0   Term  0   Preterm  0   AB  3   Living  0      SAB  1   IAB  1   Ectopic  0   Multiple  0   Live Births  0           Past Medical History:  Diagnosis Date   Asthma     Past Surgical History:  Procedure Laterality Date   WISDOM TOOTH EXTRACTION      Family History  Problem Relation Age of Onset   Hypertension Father     Social History   Tobacco Use   Smoking status: Former    Types: Cigarettes    Quit date: 04/22/2016    Years since quitting: 5.5   Smokeless tobacco: Never  Vaping Use   Vaping Use: Former  Substance Use Topics   Alcohol use: Not Currently   Drug use: Yes    Types: Marijuana    Allergies: No Known Allergies  No medications prior to admission.    Review of Systems  Constitutional:  Negative for chills, fatigue and fever.  Gastrointestinal:  Positive for abdominal pain.  Genitourinary:  Positive for flank  pain. Negative for dysuria, frequency, hematuria and urgency.  Musculoskeletal:  Positive for back pain.  All other systems reviewed and are negative. Physical Exam   Blood pressure 122/67, pulse 87, temperature 98.5 F (36.9 C), temperature source Oral, resp. rate 16, last menstrual period 09/28/2021, SpO2 100 %, unknown if currently breastfeeding.  Physical Exam Vitals and nursing note reviewed. Exam conducted with a chaperone present.  Constitutional:      Appearance: She is well-developed.  Pulmonary:     Effort: Pulmonary effort is normal.  Abdominal:     Palpations: Abdomen is soft.     Tenderness: There is no abdominal tenderness. There is no right CVA tenderness or left CVA tenderness.  Skin:    Capillary Refill: Capillary refill takes less than 2 seconds.  Neurological:     Mental Status: She is alert and oriented to person, place, and time.  Psychiatric:        Mood and Affect: Mood normal.        Behavior: Behavior normal.    MAU Course  Procedures  Orders Placed This Encounter  Procedures   Culture, OB  Urine   US OB Transvaginal   Urinalysis, Routine w reflex microscopic Urine, Clean Catch   CBC   Comprehensive metabolic panel   hCG, quantitative, pregnancy   Urinalysis, Microscopic (reflex)   Diet NPO time specified   Discharge patient   Patient Vitals for the past 24 hrs:  BP Temp Temp src Pulse Resp SpO2  11/09/21 1512 116/69 -- -- 91 -- --  11/09/21 1329 122/67 -- -- 87 -- --  11/09/21 1326 -- 98.5 F (36.9 C) Oral -- 16 100 %   Results for orders placed or performed during the hospital encounter of 11/09/21 (from the past 24 hour(s))  Urinalysis, Routine w reflex microscopic Urine, Clean Catch     Status: Abnormal   Collection Time: 11/09/21  1:23 PM  Result Value Ref Range   Color, Urine YELLOW YELLOW   APPearance CLOUDY (A) CLEAR   Specific Gravity, Urine 1.020 1.005 - 1.030   pH 6.0 5.0 - 8.0   Glucose, UA NEGATIVE NEGATIVE mg/dL   Hgb  urine dipstick LARGE (A) NEGATIVE   Bilirubin Urine NEGATIVE NEGATIVE   Ketones, ur NEGATIVE NEGATIVE mg/dL   Protein, ur 100 (A) NEGATIVE mg/dL   Nitrite POSITIVE (A) NEGATIVE   Leukocytes,Ua LARGE (A) NEGATIVE  Urinalysis, Microscopic (reflex)     Status: Abnormal   Collection Time: 11/09/21  1:23 PM  Result Value Ref Range   RBC / HPF >50 0 - 5 RBC/hpf   WBC, UA >50 0 - 5 WBC/hpf   Bacteria, UA MANY (A) NONE SEEN   Squamous Epithelial / LPF 0-5 0 - 5   WBC Clumps PRESENT    Mucus PRESENT   CBC     Status: Abnormal   Collection Time: 11/09/21  1:27 PM  Result Value Ref Range   WBC 11.0 (H) 4.0 - 10.5 K/uL   RBC 4.11 3.87 - 5.11 MIL/uL   Hemoglobin 12.9 12.0 - 15.0 g/dL   HCT 37.9 36.0 - 46.0 %   MCV 92.2 80.0 - 100.0 fL   MCH 31.4 26.0 - 34.0 pg   MCHC 34.0 30.0 - 36.0 g/dL   RDW 13.0 11.5 - 15.5 %   Platelets 245 150 - 400 K/uL   nRBC 0.0 0.0 - 0.2 %  Comprehensive metabolic panel     Status: Abnormal   Collection Time: 11/09/21  1:27 PM  Result Value Ref Range   Sodium 134 (L) 135 - 145 mmol/L   Potassium 3.4 (L) 3.5 - 5.1 mmol/L   Chloride 103 98 - 111 mmol/L   CO2 25 22 - 32 mmol/L   Glucose, Bld 98 70 - 99 mg/dL   BUN 7 6 - 20 mg/dL   Creatinine, Ser 0.68 0.44 - 1.00 mg/dL   Calcium 8.7 (L) 8.9 - 10.3 mg/dL   Total Protein 6.8 6.5 - 8.1 g/dL   Albumin 3.7 3.5 - 5.0 g/dL   AST 20 15 - 41 U/L   ALT 21 0 - 44 U/L   Alkaline Phosphatase 38 38 - 126 U/L   Total Bilirubin 0.7 0.3 - 1.2 mg/dL   GFR, Estimated >60 >60 mL/min   Anion gap 6 5 - 15  hCG, quantitative, pregnancy     Status: Abnormal   Collection Time: 11/09/21  1:27 PM  Result Value Ref Range   hCG, Beta Chain, Quant, S 15,679 (H) <5 mIU/mL   US OB Transvaginal  Result Date: 11/09/2021 CLINICAL DATA:  LEFT lower quadrant pain for 2 days,  pregnant, LMP 09/28/2021 EXAM: TRANSVAGINAL OB ULTRASOUND TECHNIQUE: Transvaginal ultrasound was performed for complete evaluation of the gestation as well as the  maternal uterus, adnexal regions, and pelvic cul-de-sac. COMPARISON:  11/02/2021 FINDINGS: Intrauterine gestational sac: Present, single Yolk sac:  Present Embryo:  Present Cardiac Activity: Present Heart Rate: 97 bpm CRL: 3.4  mm   5 w 6 d                  [place] EDC: 07/06/2022 Subchorionic hemorrhage:  None Maternal uterus/adnexae: Remainder of maternal uterus normal appearance. RIGHT ovary unremarkable. Corpus luteal cyst LEFT ovary. No free pelvic fluid or adnexal masses. IMPRESSION: Single live intrauterine gestation at at 5 weeks 6 days EGA by crown-rump length. Fetal heart rate 97 bpm, borderline low, recommend attention on follow-up imaging. Electronically Signed   By: Lavonia Dana M.D.   On: 11/09/2021 14:36    Meds ordered this encounter  Medications   acetaminophen (TYLENOL) tablet 1,000 mg   cefadroxil (DURICEF) 500 MG capsule    Sig: Take 1 capsule (500 mg total) by mouth 2 (two) times daily.    Dispense:  14 capsule    Refill:  0    Order Specific Question:   Supervising Provider    Answer:   Lynnda Shields A [id number]   acetaminophen (TYLENOL) 325 MG tablet    Sig: Take 2 tablets (650 mg total) by mouth every 4 (four) hours as needed for up to 14 days for moderate pain.    Dispense:  84 tablet    Refill:  0    Order Specific Question:   Supervising Provider    Answer:   Griffin Basil [id number]   Assessment and Plan  --25 y.o. G4P0030 with live IUP at [redacted]w[redacted]d  --UTI in pregnancy, Duricef rx to pharmacy --Urine culture in work --Discharge home in stable condition  F/U: Patient has new ob appointment in office within the next week  Darlina Rumpf, CNM 11/09/2021, 3:57 PM

## 2021-11-09 NOTE — MAU Note (Addendum)
Pt reports for 2 days she has had left sided abdominal and back pain that is worse today. Pt reports trying stretching, antiacids, and drinking water with no relief.  Pt reports feeling dizzy for the last hour.   Denies vaginal bleeding.   Pt reports cysts on left ovary. Pt reports that no one told her about this, but saw it on mychart

## 2021-11-11 DIAGNOSIS — N911 Secondary amenorrhea: Secondary | ICD-10-CM | POA: Diagnosis not present

## 2021-11-11 DIAGNOSIS — Z3201 Encounter for pregnancy test, result positive: Secondary | ICD-10-CM | POA: Diagnosis not present

## 2021-11-11 LAB — CULTURE, OB URINE: Culture: >=100000 — AB

## 2021-11-16 ENCOUNTER — Ambulatory Visit (INDEPENDENT_AMBULATORY_CARE_PROVIDER_SITE_OTHER): Payer: Medicaid Other

## 2021-11-16 ENCOUNTER — Other Ambulatory Visit: Payer: Self-pay

## 2021-11-16 ENCOUNTER — Ambulatory Visit
Admission: RE | Admit: 2021-11-16 | Discharge: 2021-11-16 | Disposition: A | Discharge status: Home / Self Care | Payer: Medicaid Other | Source: Ambulatory Visit | Attending: Family Medicine | Admitting: Family Medicine

## 2021-11-16 DIAGNOSIS — Z3A01 Less than 8 weeks gestation of pregnancy: Secondary | ICD-10-CM | POA: Diagnosis not present

## 2021-11-16 DIAGNOSIS — O3680X0 Pregnancy with inconclusive fetal viability, not applicable or unspecified: Secondary | ICD-10-CM

## 2021-11-16 DIAGNOSIS — O208 Other hemorrhage in early pregnancy: Secondary | ICD-10-CM | POA: Diagnosis not present

## 2021-11-16 NOTE — Progress Notes (Signed)
Pt here today for [place] results. Pt denies any pain, cramps or vaginal bleeding. Pt has live, single IUP. Pt given pictures from MFM. Pt advised to have follow up with her OB GYN at [facility]. Pt verbalized understanding.   Judeth Cornfield, RN

## 2021-11-16 NOTE — Progress Notes (Signed)
Patient was assessed and managed by nursing staff during this encounter. I have reviewed the chart and agree with the documentation and plan. I have also made any necessary editorial changes.  Thressa Sheller DNP, CNM  11/16/21  12:10 PM

## 2021-11-27 ENCOUNTER — Encounter (HOSPITAL_COMMUNITY): Payer: Self-pay | Admitting: Obstetrics and Gynecology

## 2021-11-27 ENCOUNTER — Inpatient Hospital Stay (HOSPITAL_COMMUNITY)
Admission: AD | Admit: 2021-11-27 | Discharge: 2021-11-27 | Disposition: A | Discharge status: Home / Self Care | Payer: Medicaid Other | Attending: Obstetrics and Gynecology | Admitting: Obstetrics and Gynecology

## 2021-11-27 ENCOUNTER — Other Ambulatory Visit: Payer: Self-pay

## 2021-11-27 ENCOUNTER — Inpatient Hospital Stay (HOSPITAL_COMMUNITY): Payer: Medicaid Other

## 2021-11-27 DIAGNOSIS — O209 Hemorrhage in early pregnancy, unspecified: Secondary | ICD-10-CM

## 2021-11-27 DIAGNOSIS — R300 Dysuria: Secondary | ICD-10-CM | POA: Insufficient documentation

## 2021-11-27 DIAGNOSIS — O208 Other hemorrhage in early pregnancy: Secondary | ICD-10-CM | POA: Insufficient documentation

## 2021-11-27 DIAGNOSIS — O418X90 Other specified disorders of amniotic fluid and membranes, unspecified trimester, not applicable or unspecified: Secondary | ICD-10-CM

## 2021-11-27 DIAGNOSIS — Z3A08 8 weeks gestation of pregnancy: Secondary | ICD-10-CM | POA: Insufficient documentation

## 2021-11-27 DIAGNOSIS — R309 Painful micturition, unspecified: Secondary | ICD-10-CM | POA: Diagnosis present

## 2021-11-27 LAB — WET PREP, GENITAL
Clue Cells Wet Prep HPF POC: NONE SEEN
Sperm: NONE SEEN
Trich, Wet Prep: NONE SEEN
WBC, Wet Prep HPF POC: <10 (ref <10)
Yeast Wet Prep HPF POC: NONE SEEN

## 2021-11-27 LAB — URINALYSIS, ROUTINE W REFLEX MICROSCOPIC
Bilirubin Urine: NEGATIVE
Glucose, UA: NEGATIVE mg/dL
Hgb urine dipstick: NEGATIVE
Ketones, ur: 15 mg/dL — AB
Leukocytes,Ua: NEGATIVE
Nitrite: NEGATIVE
Protein, ur: NEGATIVE mg/dL
Specific Gravity, Urine: >1.03 — ABNORMAL HIGH (ref 1.005–1.030)
pH: 6 (ref 5.0–8.0)

## 2021-11-27 NOTE — MAU Note (Signed)
Pt reports to MAU with c/o burning with urination, odor, and menstraul like cramps in the lower abdomen.  Pt states that she had a fall a couple of days ago where she fell down a couple of steps onto the ground and that is when the vaginal spotting started.  Pt states that her vaginal spotting is bright colored that she is currently wearing a pad.

## 2021-11-27 NOTE — MAU Provider Note (Signed)
Chief Complaint: Urinary Tract Infection and Vaginal Bleeding   Event Date/Time   First Provider Initiated Contact with Patient 11/27/21 1844        SUBJECTIVE HPI: Vicki Daniel is a 25 y.o. G4P0030 at [redacted]w[redacted]d by LMP who presents to maternity admissions reporting burning with urination (took all of her antibiotics from last UTI), and persistent spotting.  Acknowleges being diagnosed with a moderate subchorionic hemorrhage on last [place]. Has been prescribed cream for a presumed yeast vaginitis, but didn't use it because she was worried it would hurt the baby. She denies vaginal itching/burning, urinary symptoms, h/a, dizziness, n/v, or fever/chills.    Urinary Tract Infection  This is a recurrent problem. The problem occurs every urination. The problem has been unchanged. The quality of the pain is described as burning. Associated symptoms include a discharge. Pertinent negatives include no chills, flank pain, frequency or hematuria. She has tried nothing for the symptoms.  Vaginal Bleeding The patient's primary symptoms include vaginal bleeding. The patient's pertinent negatives include no genital itching, genital lesions, genital odor or pelvic pain. This is a recurrent problem. The problem has been unchanged. The pain is mild. She is pregnant. Pertinent negatives include no chills, diarrhea, fever, flank pain, frequency or hematuria. The vaginal discharge was bloody. The vaginal bleeding is spotting. She has not been passing clots. She has not been passing tissue. Nothing aggravates the symptoms. She has tried nothing for the symptoms.  RN Note: Pt reports to MAU with c/o burning with urination, odor, and menstraul like cramps in the lower abdomen.  Pt states that she had a fall a couple of days ago where she fell down a couple of steps onto the ground and that is when the vaginal spotting started.  Pt states that her vaginal spotting is bright colored that she is currently wearing a pad  Past Medical  History:  Diagnosis Date   Asthma    Past Surgical History:  Procedure Laterality Date   WISDOM TOOTH EXTRACTION     Social History   Socioeconomic History   Marital status: Single    Spouse name: Not on file   Number of children: Not on file   Years of education: Not on file   Highest education level: Not on file  Occupational History   Not on file  Tobacco Use   Smoking status: Never   Smokeless tobacco: Never  Vaping Use   Vaping Use: Former  Substance and Sexual Activity   Alcohol use: Not Currently   Drug use: Not Currently    Types: Marijuana    Comment: stopped a week before found out pregnant   Sexual activity: Yes    Birth control/protection: None  Other Topics Concern   Not on file  Social History Narrative   Not on file   Social Determinants of Health   Financial Resource Strain: Not on file  Food Insecurity: Not on file  Transportation Needs: Not on file  Physical Activity: Not on file  Stress: Not on file  Social Connections: Not on file  Intimate Partner Violence: Not on file   No current facility-administered medications on file prior to encounter.   Current Outpatient Medications on File Prior to Encounter  Medication Sig Dispense Refill   Oyster Shell (OYSTER CALCIUM) 500 MG TABS tablet Take 500 mg of elemental calcium by mouth 2 (two) times daily.     Prenatal Vit-Fe Fumarate-FA (PREPLUS) 27-1 MG TABS Take by mouth.     cefadroxil (DURICEF) 500  MG capsule Take 1 capsule (500 mg total) by mouth 2 (two) times daily. 14 capsule 0   [DISCONTINUED] promethazine (PHENERGAN) 25 MG suppository Place 1 suppository (25 mg total) rectally every 6 (six) hours as needed for nausea or vomiting. (Patient not taking: Reported on 07/18/2020) 12 each 0   No Known Allergies  I have reviewed patient's Past Medical Hx, Surgical Hx, Family Hx, Social Hx, medications and allergies.   ROS:  Review of Systems  Constitutional:  Negative for chills and fever.   Gastrointestinal:  Negative for diarrhea.  Genitourinary:  Positive for vaginal bleeding. Negative for flank pain, frequency, hematuria and pelvic pain.  Review of Systems  Other systems negative   Physical Exam  Physical Exam Patient Vitals for the past 24 hrs:  BP Temp Temp src Pulse Resp SpO2 Weight  11/27/21 1828 113/65 98.2 F (36.8 C) Oral 95 18 99 % 98.3 kg   Constitutional: Well-developed, well-nourished female in no acute distress.  Cardiovascular: normal rate Respiratory: normal effort GI: Abd soft, non-tender.  MS: Extremities nontender, no edema, normal ROM Neurologic: Alert and oriented x 4.  GU: Neg CVAT.  PELVIC EXAM: deferred.  Will get wet prep by blind swab  LAB RESULTS Results for orders placed or performed during the hospital encounter of 11/27/21 (from the past 24 hour(s))  Urinalysis, Routine w reflex microscopic     Status: Abnormal   Collection Time: 11/27/21  6:33 PM  Result Value Ref Range   Color, Urine YELLOW YELLOW   APPearance CLEAR CLEAR   Specific Gravity, Urine >1.030 (H) 1.005 - 1.030   pH 6.0 5.0 - 8.0   Glucose, UA NEGATIVE NEGATIVE mg/dL   Hgb urine dipstick NEGATIVE NEGATIVE   Bilirubin Urine NEGATIVE NEGATIVE   Ketones, ur 15 (A) NEGATIVE mg/dL   Protein, ur NEGATIVE NEGATIVE mg/dL   Nitrite NEGATIVE NEGATIVE   Leukocytes,Ua NEGATIVE NEGATIVE  Wet prep, genital     Status: None   Collection Time: 11/27/21  6:43 PM  Result Value Ref Range   Yeast Wet Prep HPF POC NONE SEEN NONE SEEN   Trich, Wet Prep NONE SEEN NONE SEEN   Clue Cells Wet Prep HPF POC NONE SEEN NONE SEEN   WBC, Wet Prep HPF POC <10 <10   Sperm NONE SEEN      IMAGING US OB Comp Less 14 Wks  Result Date: 11/27/2021 CLINICAL DATA:  Spotting and cramping. EXAM: OBSTETRIC <14 WK ULTRASOUND TECHNIQUE: Transabdominal ultrasound was performed for evaluation of the gestation as well as the maternal uterus and adnexal regions. COMPARISON:  Obstetrical ultrasound  11/16/2021. FINDINGS: Intrauterine gestational sac: Single Yolk sac:  Visualized. Embryo:  Visualized. Cardiac Activity: Visualized. Heart Rate: 169 bpm CRL:   20.2 mm mm   8 w 4 d                  [place] EDC: 07/05/2022 Subchorionic hemorrhage: Small subchorionic hemorrhage seen posteriorly measuring 1.5 x 0.7 x 1.5 cm. Maternal uterus/adnexae: Maternal ovaries are within normal limits. IMPRESSION: 1. Single live intrauterine gestation measuring 8 weeks and 4 days. 2. Small subchorionic hemorrhage. Electronically Signed   By: Ronney Asters M.D.   On: 11/27/2021 20:01     MAU Management/MDM: Ordered Wet prep and [place]  Discussed results of wet prep are negative, so no need to use cream (though reviewed it would be safe to use it in future if needed) Reviewed UA results show clearing of signs of infection Reviewed US showed  [facility] is smaller and FHR is stable   Pt reassured Dr Wilhelmenia Blase happened to come down and I notified her of results   ASSESSMENT 1. Bleeding in early pregnancy   2. Subchorionic hemorrhage   3.      Dysuria  PLAN Discharge home Pelvic rest Followup in office  Pt stable at time of discharge. Encouraged to return here if she develops worsening of symptoms, increase in pain, fever, or other concerning symptoms.    Hansel Feinstein CNM, MSN Certified Nurse-Midwife 11/27/2021  6:44 PM

## 2021-12-01 ENCOUNTER — Telehealth (INDEPENDENT_AMBULATORY_CARE_PROVIDER_SITE_OTHER): Payer: Medicaid Other | Admitting: Nurse Practitioner

## 2021-12-01 ENCOUNTER — Other Ambulatory Visit: Payer: Self-pay

## 2021-12-01 ENCOUNTER — Encounter: Payer: Self-pay | Admitting: Nurse Practitioner

## 2021-12-01 DIAGNOSIS — Z3A08 8 weeks gestation of pregnancy: Secondary | ICD-10-CM

## 2021-12-01 DIAGNOSIS — L709 Acne, unspecified: Secondary | ICD-10-CM | POA: Diagnosis not present

## 2021-12-01 NOTE — Progress Notes (Signed)
Virtual Visit via Telephone Note  I connected with Merrell Rettinger on 12/01/21 at  9:40 AM EST by telephone and verified that I am speaking with the correct person using two identifiers.  Location: Patient: home Provider: office   I discussed the limitations, risks, security and privacy concerns of performing an evaluation and management service by telephone and the availability of in person appointments. I also discussed with the patient that there may be a patient responsible charge related to this service. The patient expressed understanding and agreed to proceed.   History of Present Illness:  Patient presents today for telephone visit.  Patient states that she has had acne for the past 2 years.  She states that she is currently 2 months pregnant and her acne has been worsening since pregnancy started.  She states that the acne is located on her chin and neck.  Patient washes her face daily with Aveeno clear complexion face wash.  She has tried several different types of face washes with minimal relief noted.  She would like referral to dermatologist. Denies f/c/s, n/v/d, hemoptysis, PND, chest pain or edema.     Observations/Objective:  Vitals with BMI 11/27/2021 11/27/2021 11/09/2021  Height - - -  Weight - 216 lbs 13 oz -  BMI - 32 -  Systolic 121 113 812  Diastolic 60 65 69  Pulse 81 95 91      Assessment and Plan:  Acne in pregnancy:  Will place referral to dermatology  Used gentle face wash  Follow up:  Follow up if needed    I discussed the assessment and treatment plan with the patient. The patient was provided an opportunity to ask questions and all were answered. The patient agreed with the plan and demonstrated an understanding of the instructions.   The patient was advised to call back or seek an in-person evaluation if the symptoms worsen or if the condition fails to improve as anticipated.  I provided 23 minutes of non-face-to-face time during this  encounter.   Ivonne Andrew, NP

## 2021-12-01 NOTE — Patient Instructions (Signed)
Acne in pregnancy:  Will place referral to dermatology  Used gentle face wash  Follow up:  Follow up if needed  Acne Acne is a skin problem that causes small, red bumps (pimples) and other skin changes. The skin has tiny holes called pores. Each pore has an oil gland. Acne happens when the pores get blocked. The pores may become red, sore, and swollen. They may also become infected. Acne is common among teenagers. Acne usually goes away over time. What are the causes? This condition may be caused when: Oil glands get blocked by oil, dead skin cells, and dirt. Bacteria that live in the oil glands increase in number and cause infection. Acne can start with changes in hormones. These changes can occur: When children mature into their teens (adolescence). When women get their period (menstrual cycle). When women are pregnant. Some things can make acne worse. They include: Cosmetics and hair products that have oil in them. Stress. Diseases that cause changes in hormones. Some medicines. Headbands, backpacks, or shoulder pads. Being near certain oils and chemicals. Foods that are high in sugars. These include dairy products, sweets, and chocolates. What increases the risk? You are more likely to develop this condition if: You are a teenager. You have a family history of acne. What are the signs or symptoms? Symptoms of this condition include: Small, red bumps (pimples or papules). Whiteheads. Blackheads. Small, pus-filled pimples (pustules). Big, red pimples or pustules that feel tender. Acne that is very bad can cause: An abscess. This is an area that has pus. Cysts. These are hard, painful sacs that have fluid. Scars. These can happen after large pimples heal. How is this treated? Treatment for this condition depends on how bad your acne is. It may include: Creams and lotions. These can: Keep the pores of your skin open. Prevent infections and swelling. Medicines that  treat infections (antibiotics). These can be put on your skin or taken as pills. Pills that decrease the amount of oil in your skin. Birth control pills. Light or laser treatments. Shots of medicine into the areas with acne. Chemicals that cause the skin to peel. Surgery. Follow these instructions at home: Good skin care is the most important thing you can do to treat your acne. Take care of your skin as told by your doctor. You may be told to do these things: Wash your skin gently at least two times each day. You should also wash your skin: After you exercise. Before you go to bed. Use mild soap. Use a water-based skin moisturizer after you wash your skin. Use a sunscreen or sunblock with SPF 30 or greater. This is very important if you are using acne medicines. Choose cosmetics that will not block your oil glands (are noncomedogenic). Medicines Take over-the-counter and prescription medicines only as told by your doctor. If you were prescribed an antibiotic medicine, use it or take it as told by your doctor. Do not stop using the antibiotic even if your acne gets better. General instructions Keep your hair clean and off your face. Shampoo your hair on a regular basis. If you have oily hair, you may need to wash it every day. Avoid wearing tight headbands or hats. Avoid picking or squeezing your pimples. That can make your acne worse and cause it to scar. Shave gently. Only shave when you have to. Keep a food journal. This can help you see if any foods are linked to your acne. Keep all follow-up visits as told by  your doctor. This is important. Contact a doctor if: Your acne is not better after eight weeks. Your acne gets worse. You have a large area of skin that is red or tender. You think that you are having side effects from any acne medicine. Summary Acne is a skin problem that causes pimples. Acne is common among teenagers. Acne usually goes away over time. Acne starts with  changes in your hormones. Other causes include stress, diet, and some medicines. Follow your doctor's instructions on how to take care of your skin. Good skin care is the most important thing you can do to treat your acne. Take over-the-counter and prescription medicines only as told by your doctor. Contact your doctor if you think that you are having side effects from any acne medicine. This information is not intended to replace advice given to you by your health care provider. Make sure you discuss any questions you have with your health care provider. Document Revised: 04/10/2018 Document Reviewed: 04/10/2018 Elsevier Patient Education  2022 [employer].

## 2021-12-08 ENCOUNTER — Telehealth: Payer: Medicaid Other

## 2021-12-08 ENCOUNTER — Other Ambulatory Visit: Payer: Self-pay

## 2021-12-08 DIAGNOSIS — Z113 Encounter for screening for infections with a predominantly sexual mode of transmission: Secondary | ICD-10-CM | POA: Diagnosis not present

## 2021-12-08 DIAGNOSIS — Z3689 Encounter for other specified antenatal screening: Secondary | ICD-10-CM | POA: Diagnosis not present

## 2021-12-08 DIAGNOSIS — Z363 Encounter for antenatal screening for malformations: Secondary | ICD-10-CM | POA: Diagnosis not present

## 2021-12-08 DIAGNOSIS — Z368A Encounter for antenatal screening for other genetic defects: Secondary | ICD-10-CM | POA: Diagnosis not present

## 2021-12-08 DIAGNOSIS — O26891 Other specified pregnancy related conditions, first trimester: Secondary | ICD-10-CM | POA: Diagnosis not present

## 2021-12-08 DIAGNOSIS — Z3A09 9 weeks gestation of pregnancy: Secondary | ICD-10-CM | POA: Diagnosis not present

## 2021-12-08 LAB — OB RESULTS CONSOLE ANTIBODY SCREEN: Antibody Screen: NEGATIVE

## 2021-12-08 LAB — OB RESULTS CONSOLE GC/CHLAMYDIA
Chlamydia: NEGATIVE
Neisseria Gonorrhea: NEGATIVE

## 2021-12-08 LAB — OB RESULTS CONSOLE ABO/RH: RH Type: NEGATIVE

## 2021-12-08 LAB — HEPATITIS C ANTIBODY: HCV Ab: NEGATIVE

## 2021-12-12 NOTE — L&D Delivery Note (Signed)
Delivery Note She progressed to complete and pushed for around an hour.  At 10:12 AM a viable female was delivered via Vaginal, Spontaneous (Presentation:   Occiput Anterior).  APGAR: 8, 9; weight pending.  Posterior/left arm delivered first.   Placenta status: Spontaneous, Intact.  Cord: 3 vessels with the following complications: Nuchal x 2 reduced   Anesthesia: Epidural;Local Episiotomy: None Lacerations:  2nd degree Suture Repair: 3.0 vicryl Est. Blood Loss (mL):  215  Mom to postpartum.  Baby to Couplet care / Skin to Skin.  They would like circumcision for baby, questions answered.  [name] [name] 07/13/2022, 10:43 AM

## 2021-12-15 ENCOUNTER — Encounter (HOSPITAL_COMMUNITY): Payer: Self-pay | Admitting: Obstetrics and Gynecology

## 2021-12-15 ENCOUNTER — Inpatient Hospital Stay (HOSPITAL_COMMUNITY)
Admission: AD | Admit: 2021-12-15 | Discharge: 2021-12-15 | Disposition: A | Discharge status: Home / Self Care | Payer: Medicaid Other | Attending: Obstetrics and Gynecology | Admitting: Obstetrics and Gynecology

## 2021-12-15 DIAGNOSIS — O9A211 Injury, poisoning and certain other consequences of external causes complicating pregnancy, first trimester: Secondary | ICD-10-CM

## 2021-12-15 DIAGNOSIS — R1030 Lower abdominal pain, unspecified: Secondary | ICD-10-CM | POA: Diagnosis not present

## 2021-12-15 DIAGNOSIS — Z3A11 11 weeks gestation of pregnancy: Secondary | ICD-10-CM | POA: Diagnosis not present

## 2021-12-15 DIAGNOSIS — O26891 Other specified pregnancy related conditions, first trimester: Secondary | ICD-10-CM | POA: Diagnosis not present

## 2021-12-15 DIAGNOSIS — O9A311 Physical abuse complicating pregnancy, first trimester: Secondary | ICD-10-CM | POA: Diagnosis not present
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
## 2021-12-15 NOTE — MAU Note (Signed)
Patient signed physical copy of AVS d/t E-signature not working in room. Signed page placed in Medical Records bin.
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
## 2021-12-15 NOTE — MAU Note (Signed)
Social work at bedside.  
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
## 2021-12-29 ENCOUNTER — Inpatient Hospital Stay (HOSPITAL_COMMUNITY)
Admission: AD | Admit: 2021-12-29 | Discharge: 2021-12-29 | Disposition: A | Discharge status: Home / Self Care | Payer: Medicaid Other | Attending: Obstetrics and Gynecology | Admitting: Obstetrics and Gynecology

## 2021-12-29 ENCOUNTER — Encounter (HOSPITAL_COMMUNITY): Payer: Self-pay | Admitting: Obstetrics and Gynecology

## 2021-12-29 DIAGNOSIS — Z87891 Personal history of nicotine dependence: Secondary | ICD-10-CM | POA: Insufficient documentation

## 2021-12-29 DIAGNOSIS — R109 Unspecified abdominal pain: Secondary | ICD-10-CM | POA: Insufficient documentation

## 2021-12-29 DIAGNOSIS — Z3A13 13 weeks gestation of pregnancy: Secondary | ICD-10-CM | POA: Diagnosis not present

## 2021-12-29 DIAGNOSIS — O26892 Other specified pregnancy related conditions, second trimester: Secondary | ICD-10-CM | POA: Diagnosis not present

## 2021-12-29 DIAGNOSIS — O26852 Spotting complicating pregnancy, second trimester: Secondary | ICD-10-CM

## 2021-12-29 DIAGNOSIS — O23591 Infection of other part of genital tract in pregnancy, first trimester: Secondary | ICD-10-CM | POA: Insufficient documentation

## 2021-12-29 DIAGNOSIS — N760 Acute vaginitis: Secondary | ICD-10-CM | POA: Diagnosis not present

## 2021-12-29 DIAGNOSIS — O209 Hemorrhage in early pregnancy, unspecified: Secondary | ICD-10-CM | POA: Insufficient documentation

## 2021-12-29 DIAGNOSIS — B9689 Other specified bacterial agents as the cause of diseases classified elsewhere: Secondary | ICD-10-CM | POA: Insufficient documentation

## 2021-12-29 DIAGNOSIS — O26891 Other specified pregnancy related conditions, first trimester: Secondary | ICD-10-CM | POA: Insufficient documentation

## 2021-12-29 DIAGNOSIS — F439 Reaction to severe stress, unspecified: Secondary | ICD-10-CM

## 2021-12-29 LAB — URINALYSIS, ROUTINE W REFLEX MICROSCOPIC
Bilirubin Urine: NEGATIVE
Glucose, UA: NEGATIVE mg/dL
Hgb urine dipstick: NEGATIVE
Ketones, ur: NEGATIVE mg/dL
Leukocytes,Ua: NEGATIVE
Nitrite: NEGATIVE
Protein, ur: NEGATIVE mg/dL
Specific Gravity, Urine: >1.03 — ABNORMAL HIGH (ref 1.005–1.030)
pH: 6 (ref 5.0–8.0)
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
## 2022-01-03 ENCOUNTER — Ambulatory Visit: Payer: Medicaid Other

## 2022-01-03 DIAGNOSIS — L700 Acne vulgaris: Secondary | ICD-10-CM | POA: Diagnosis not present

## 2022-01-05 DIAGNOSIS — Z3481 Encounter for supervision of other normal pregnancy, first trimester: Secondary | ICD-10-CM | POA: Diagnosis not present

## 2022-01-05 DIAGNOSIS — Z3482 Encounter for supervision of other normal pregnancy, second trimester: Secondary | ICD-10-CM | POA: Diagnosis not present

## 2022-01-10 ENCOUNTER — Ambulatory Visit: Payer: Self-pay | Admitting: Genetics

## 2022-01-10 ENCOUNTER — Other Ambulatory Visit: Payer: Self-pay

## 2022-01-10 ENCOUNTER — Ambulatory Visit: Payer: Medicaid Other | Attending: Obstetrics and Gynecology

## 2022-01-10 DIAGNOSIS — Z141 Cystic fibrosis carrier: Secondary | ICD-10-CM | POA: Diagnosis not present

## 2022-01-10 NOTE — Progress Notes (Signed)
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   EDC: 07/08/2022 LMP: 10/01/2021 Referring Provider:  Deliah Boston, MD  EGA: [redacted]w[redacted]d Genetic Counselor: Staci Righter, MS, CGC  OB Hx: [id number] Date of Appointment: 01/10/2022  Accompanied by: Father of the current pregnancy Burna Forts) Face to Face Time: 60 Minutes   Previous Testing Completed: CBC from 11/09/2021 reviewed. MCV within normal limits. It is unlikely that Ura is a beta thalassemia carrier or an alpha thalassemia carrier of the double-gene deletion. Individuals with a normal MCV may be single-gene deletion carriers, but it is unlikely that the current pregnancy would be affected with alpha or beta thalassemia major. Leveta previously completed a hemoglobin electrophoresis on 12/08/2021. No abnormal hemoglobin bands were noted. Shirle previously completed Non-Invasive Prenatal Screening (NIPS) in this pregnancy. Deven showed genetic counseling a copy of the report on her phone during the genetic counseling appointment. The result is low risk, consistent with a female fetus. This screening significantly reduces the risk that the current pregnancy has Down syndrome, Trisomy 58, Trisomy 15, and common sex chromosome conditions, however, the risk is not zero given the limitations of NIPS. Additionally, there are many genetic conditions that cannot be detected by NIPS.  Tevis previously completed carrier screening (scanned into Epic under the Media tab). She screened to be a carrier for Cystic Fibrosis (CF). She screened to not be a carrier for Spinal Muscular Atrophy (SMA) and Fragile X syndrome (30 and 37 CGG repeats). A negative result on carrier screening reduces the likelihood of being a carrier, however, does not entirely rule out the possibility.   Medical History:  Reports she takes prenatal vitamins and calcium. Reports having a UTI and bacterial vaginosis in this  pregnancy. Denies personal history of diabetes, high blood pressure, thyroid conditions, and seizures. Denies bleeding and fevers in this pregnancy. Denies using tobacco, alcohol, or street drugs in this pregnancy.   Family History: A pedigree was created and scanned into Epic under the Media tab. Ashia reports her identical twin sister is also a carrier for Cystic Fibrosis. Linton Rump reports his 64 year old nephew has a mental disability. Linton Rump reports this nephew's mental disability is thought to be attributed to a mistake that was make during a surgery his nephew had. Linton Rump reports he has many other healthy nieces and nephews. Maternal ethnicity reported as White/Caucasian and paternal ethnicity reported as Research scientist (physical sciences). Denies Ashkenazi Jewish ancestry. Family history not remarkable for consanguinity, individuals with birth defects, autism spectrum disorder, still births, or unexplained neonatal death.     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 CF is an autosomal recessive condition caused by mutations in the gene CFTR. CF is a multisystem disease that involves the respiratory tract, exocrine pancreas, intestine, hepatobiliary system, female genital tract, and exocrine sweat glands. Morbidities include progressive obstructive lung disease with bronchiectasis, frequent hospitalizations for pulmonary disease, pancreatic insufficiency and malnutrition, recurrent sinusitis and bronchitis, and female infertility. The overall median predicted survival is estimated at 40.7 years. CF has not been shown to be associated with intellectual disability. The CF carrier frequency is approximately 1/24 in [place], 1/25 in Non-Hispanic Caucasian populations, 1/58 in Hispanic populations, 1/61 in [place] American populations, and 1/94 in [place]. Because Tamira is a known carrier for CF, carrier screening for her reproductive partner is recommended  to determine risk for the current pregnancy. We reviewed that if her reproductive partner is found to also be a carrier for CF, there would be a 25% chance the current pregnancy and all future pregnancies the couple has together would be affected.    Testing/Screening Options:   Carrier Screening. Per the ACOG Committee Opinion 691, if an individual is found to be a carrier for a specific condition, the individual's reproductive partner should be offered testing in order to receive informed genetic counseling about potential reproductive outcomes. Genetic counseling offered Linton Rump carrier screening for CF given Gaynelle's carrier status.     Patient Plan:  Proceed with: Linton Rump completed a [employer] saliva kit for CF testing in [facility] (01/10/22). Results will be communicated to the couple when they become available.  Informed consent was obtained. All questions were answered.    Thank you for sharing in the care of Zameria with [place].  Please do not hesitate to contact us if you have any questions.  Staci Righter, MS, [facility]

## 2022-01-26 ENCOUNTER — Telehealth: Payer: Self-pay | Admitting: Genetics

## 2022-01-26 NOTE — Telephone Encounter (Signed)
Called Vicki Daniel to return Vicki Daniel's carrier screening results. Left voicemail with [facility] call back number.

## 2022-01-31 DIAGNOSIS — L700 Acne vulgaris: Secondary | ICD-10-CM | POA: Diagnosis not present

## 2022-01-31 DIAGNOSIS — L600 Ingrowing nail: Secondary | ICD-10-CM | POA: Diagnosis not present

## 2022-01-31 DIAGNOSIS — B86 Scabies: Secondary | ICD-10-CM | POA: Diagnosis not present

## 2022-02-10 DIAGNOSIS — Z3A18 18 weeks gestation of pregnancy: Secondary | ICD-10-CM | POA: Diagnosis not present

## 2022-02-10 DIAGNOSIS — Z141 Cystic fibrosis carrier: Secondary | ICD-10-CM | POA: Diagnosis not present

## 2022-02-10 DIAGNOSIS — Z363 Encounter for antenatal screening for malformations: Secondary | ICD-10-CM | POA: Diagnosis not present

## 2022-02-10 DIAGNOSIS — N898 Other specified noninflammatory disorders of vagina: Secondary | ICD-10-CM | POA: Diagnosis not present

## 2022-02-25 DIAGNOSIS — N898 Other specified noninflammatory disorders of vagina: Secondary | ICD-10-CM | POA: Diagnosis not present

## 2022-02-25 DIAGNOSIS — N760 Acute vaginitis: Secondary | ICD-10-CM | POA: Diagnosis not present

## 2022-03-18 ENCOUNTER — Other Ambulatory Visit: Payer: Self-pay

## 2022-03-18 ENCOUNTER — Encounter (HOSPITAL_COMMUNITY): Payer: Self-pay | Admitting: Obstetrics and Gynecology

## 2022-03-18 ENCOUNTER — Inpatient Hospital Stay (HOSPITAL_COMMUNITY)
Admission: AD | Admit: 2022-03-18 | Discharge: 2022-03-18 | Disposition: A | Discharge status: Home / Self Care | Payer: Medicaid Other | Attending: Obstetrics and Gynecology | Admitting: Obstetrics and Gynecology

## 2022-03-18 DIAGNOSIS — O26892 Other specified pregnancy related conditions, second trimester: Secondary | ICD-10-CM | POA: Insufficient documentation

## 2022-03-18 DIAGNOSIS — Z87898 Personal history of other specified conditions: Secondary | ICD-10-CM

## 2022-03-18 DIAGNOSIS — Z3A24 24 weeks gestation of pregnancy: Secondary | ICD-10-CM | POA: Diagnosis not present

## 2022-03-18 DIAGNOSIS — R002 Palpitations: Secondary | ICD-10-CM | POA: Insufficient documentation

## 2022-03-18 LAB — CBC
HCT: 36.9 % (ref 36.0–46.0)
Hemoglobin: 12.5 g/dL (ref 12.0–15.0)
MCH: 32 pg (ref 26.0–34.0)
MCHC: 33.9 g/dL (ref 30.0–36.0)
MCV: 94.4 fL (ref 80.0–100.0)
Platelets: 233 10*3/uL (ref 150–400)
RBC: 3.91 MIL/uL (ref 3.87–5.11)
RDW: 13.4 % (ref 11.5–15.5)
WBC: 10.9 10*3/uL — ABNORMAL HIGH (ref 4.0–10.5)
nRBC: 0 % (ref 0.0–0.2)

## 2022-03-18 LAB — COMPREHENSIVE METABOLIC PANEL
ALT: 17 U/L (ref 0–44)
AST: 17 U/L (ref 15–41)
Albumin: 2.9 g/dL — ABNORMAL LOW (ref 3.5–5.0)
Alkaline Phosphatase: 45 U/L (ref 38–126)
Anion gap: 7 (ref 5–15)
BUN: 5 mg/dL — ABNORMAL LOW (ref 6–20)
CO2: 22 mmol/L (ref 22–32)
Calcium: 8.4 mg/dL — ABNORMAL LOW (ref 8.9–10.3)
Chloride: 107 mmol/L (ref 98–111)
Creatinine, Ser: 0.59 mg/dL (ref 0.44–1.00)
GFR, Estimated: >60 mL/min (ref >60)
Glucose, Bld: 96 mg/dL (ref 70–99)
Potassium: 3.4 mmol/L — ABNORMAL LOW (ref 3.5–5.1)
Sodium: 136 mmol/L (ref 135–145)
Total Bilirubin: 0.3 mg/dL (ref 0.3–1.2)
Total Protein: 6 g/dL — ABNORMAL LOW (ref 6.5–8.1)
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
## 2022-03-18 NOTE — MAU Note (Signed)
RN called Social Work and spoke with Lawanna Kobus who is going to come and see the patient in MAU.  ?
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
## 2022-04-08 DIAGNOSIS — N898 Other specified noninflammatory disorders of vagina: Secondary | ICD-10-CM | POA: Diagnosis not present

## 2022-04-08 DIAGNOSIS — N760 Acute vaginitis: Secondary | ICD-10-CM | POA: Diagnosis not present

## 2022-04-08 DIAGNOSIS — Z113 Encounter for screening for infections with a predominantly sexual mode of transmission: Secondary | ICD-10-CM | POA: Diagnosis not present

## 2022-04-10 ENCOUNTER — Other Ambulatory Visit: Payer: Self-pay

## 2022-04-10 ENCOUNTER — Ambulatory Visit
Admission: EM | Admit: 2022-04-10 | Discharge: 2022-04-10 | Disposition: A | Discharge status: Home / Self Care | Payer: Medicaid Other | Attending: Urgent Care | Admitting: Urgent Care

## 2022-04-10 ENCOUNTER — Encounter: Payer: Self-pay | Admitting: Emergency Medicine

## 2022-04-10 DIAGNOSIS — Z3A29 29 weeks gestation of pregnancy: Secondary | ICD-10-CM | POA: Diagnosis not present

## 2022-04-10 DIAGNOSIS — J4530 Mild persistent asthma, uncomplicated: Secondary | ICD-10-CM | POA: Diagnosis not present

## 2022-04-10 DIAGNOSIS — R062 Wheezing: Secondary | ICD-10-CM

## 2022-04-10 DIAGNOSIS — J309 Allergic rhinitis, unspecified: Secondary | ICD-10-CM | POA: Diagnosis not present

## 2022-04-10 MED ORDER — ALBUTEROL SULFATE HFA 108 (90 BASE) MCG/ACT IN AERS: INHALATION_SPRAY | RESPIRATORY_TRACT | 0 refills | Status: DC

## 2022-04-10 MED ORDER — FLUTICASONE PROPIONATE 50 MCG/ACT NA SUSP: 2.0000 | Freq: Every day | NASAL | 12 refills | Status: DC

## 2022-04-10 NOTE — ED Provider Notes (Signed)
?[place] ? ? ?MRN: [id number] DOB: 01/06/1996 ? ?Subjective:  ? ?Vicki Daniel is a 26 y.o. female presenting for recurrent wheezing, sneezing, intermittent shortness of breath. No chest pain, fevers, body aches. Does not want COVID testing.  Has a history of asthma but has not used an albuterol inhaler out of concern for pregnancy.  She just found Zyrtec and took 1 pill today. ? ?No current facility-administered medications for this encounter. ? ?Current Outpatient Medications:  ?  Prenatal Vit-Fe Fumarate-FA (PREPLUS) 27-1 MG TABS, Take by mouth., Disp: , Rfl:  ?  cefadroxil (DURICEF) 500 MG capsule, Take 1 capsule (500 mg total) by mouth 2 (two) times daily., Disp: 14 capsule, Rfl: 0 ?  metroNIDAZOLE (METROGEL) 0.75 % vaginal gel, Place 1 Applicatorful vaginally 2 (two) times daily., Disp: , Rfl:  ?  Oyster Shell (OYSTER CALCIUM) 500 MG TABS tablet, Take 500 mg of elemental calcium by mouth 2 (two) times daily., Disp: , Rfl:   ? ?No Known Allergies ? ?Past Medical History:  ?Diagnosis Date  ? Asthma   ?  ? ?Past Surgical History:  ?Procedure Laterality Date  ? WISDOM TOOTH EXTRACTION    ? ? ?Family History  ?Problem Relation Age of Onset  ? Hypertension Father   ? ? ?Social History  ? ?Tobacco Use  ? Smoking status: Never  ? Smokeless tobacco: Never  ?Vaping Use  ? Vaping Use: Former  ?Substance Use Topics  ? Alcohol use: Not Currently  ? Drug use: Not Currently  ?  Types: Marijuana  ?  Comment: stopped a week before found out pregnant  ? ? ?ROS ? ? ?Objective:  ? ?Vitals: ?BP 116/75 (BP Location: Left Arm)   Pulse 100   Temp 98.9 ?F (37.2 ?C) (Oral)   Resp 18   Ht 5\' 9"  (1.753 m)   Wt 231 lb (104.8 kg)   LMP 09/28/2021   SpO2 96%   BMI 34.11 kg/m?  ? ?Physical Exam ?Constitutional:   ?   General: She is not in acute distress. ?   Appearance: Normal appearance. She is well-developed. She is not ill-appearing, toxic-appearing or diaphoretic.  ?HENT:  ?   Head: Normocephalic and  atraumatic.  ?   Nose: Nose normal.  ?   Mouth/Throat:  ?   Mouth: Mucous membranes are moist.  ?Eyes:  ?   General: No scleral icterus.    ?   Right eye: No discharge.     ?   Left eye: No discharge.  ?   Extraocular Movements: Extraocular movements intact.  ?Cardiovascular:  ?   Rate and Rhythm: Normal rate.  ?   Heart sounds: No murmur heard. ?  No friction rub. No gallop.  ?Pulmonary:  ?   Effort: Pulmonary effort is normal. No respiratory distress.  ?   Breath sounds: No stridor. Examination of the right-middle field reveals wheezing. Examination of the left-middle field reveals wheezing. Examination of the right-lower field reveals wheezing. Examination of the left-lower field reveals wheezing. Wheezing present. No rhonchi or rales.  ?Chest:  ?   Chest wall: No tenderness.  ?Skin: ?   General: Skin is warm and dry.  ?Neurological:  ?   General: No focal deficit present.  ?   Mental Status: She is alert and oriented to person, place, and time.  ?Psychiatric:     ?   Mood and Affect: Mood normal.     ?   Behavior: Behavior normal.  ? ? ?Assessment and  Plan :  ? ?PDMP not reviewed this encounter. ? ?1. Mild persistent asthma without complication   ?2. Wheezing   ?3. Allergic rhinitis, unspecified seasonality, unspecified trigger   ?4. [redacted] weeks gestation of pregnancy   ? ? Start Zyrtec and Flonase daily. Advised that she use her albuterol inhaler for her asthma as it is safe in pregnancy. Patient declined COVID testing. Will defer imaging given her pregnancy. Counseled patient on potential for adverse effects with medications prescribed/recommended today, ER and return-to-clinic precautions discussed, patient verbalized understanding. ? ?  ?Jaynee Eagles, PA-C ?04/10/22 1010 ? ?

## 2022-04-10 NOTE — ED Triage Notes (Signed)
Patient c/o productive cough, sneezing, wheezing, SOB, history of asthma.  Patient has an inhaler unsure if she can use due to pregnancy.  Patient is currently [redacted] weeks pregnant.  Patient is taken Zyrtec. ?

## 2022-04-12 ENCOUNTER — Inpatient Hospital Stay (HOSPITAL_COMMUNITY)
Admission: AD | Admit: 2022-04-12 | Discharge: 2022-04-12 | Disposition: A | Discharge status: Home / Self Care | Payer: Medicaid Other | Attending: Obstetrics and Gynecology | Admitting: Obstetrics and Gynecology

## 2022-04-12 ENCOUNTER — Other Ambulatory Visit: Payer: Self-pay

## 2022-04-12 ENCOUNTER — Encounter (HOSPITAL_COMMUNITY): Payer: Self-pay | Admitting: Obstetrics and Gynecology

## 2022-04-12 DIAGNOSIS — K529 Noninfective gastroenteritis and colitis, unspecified: Secondary | ICD-10-CM

## 2022-04-12 DIAGNOSIS — Z3A28 28 weeks gestation of pregnancy: Secondary | ICD-10-CM | POA: Diagnosis not present

## 2022-04-12 DIAGNOSIS — O218 Other vomiting complicating pregnancy: Secondary | ICD-10-CM | POA: Insufficient documentation

## 2022-04-12 DIAGNOSIS — O99513 Diseases of the respiratory system complicating pregnancy, third trimester: Secondary | ICD-10-CM | POA: Insufficient documentation

## 2022-04-12 DIAGNOSIS — R109 Unspecified abdominal pain: Secondary | ICD-10-CM | POA: Diagnosis not present

## 2022-04-12 LAB — URINALYSIS, ROUTINE W REFLEX MICROSCOPIC
Bilirubin Urine: NEGATIVE
Glucose, UA: NEGATIVE mg/dL
Hgb urine dipstick: NEGATIVE
Ketones, ur: 80 mg/dL — AB
Leukocytes,Ua: NEGATIVE
Nitrite: NEGATIVE
Protein, ur: NEGATIVE mg/dL
Specific Gravity, Urine: 1.027 (ref 1.005–1.030)
pH: 5 (ref 5.0–8.0)

## 2022-04-12 MED ORDER — FAMOTIDINE IN NACL 20-0.9 MG/50ML-% IV SOLN
20.0000 mg | Freq: Once | INTRAVENOUS | Status: AC
  Administered 2022-04-12: 20 mg via INTRAVENOUS
  Filled 2022-04-12: qty 50

## 2022-04-12 MED ORDER — LACTATED RINGERS IV BOLUS
1000.0000 mL | Freq: Once | INTRAVENOUS | Status: AC
  Administered 2022-04-12: 1000 mL via INTRAVENOUS

## 2022-04-12 MED ORDER — SODIUM CHLORIDE 0.9 % IV SOLN
8.0000 mg | Freq: Once | INTRAVENOUS | Status: AC
  Administered 2022-04-12: 8 mg via INTRAVENOUS
  Filled 2022-04-12 (×2): qty 4

## 2022-04-12 NOTE — MAU Provider Note (Signed)
?History  ?  ? ?CSN: [id number] ? ?Arrival date and time: 04/12/22 1345 ? ? Event Date/Time  ? First Provider Initiated Contact with Patient 04/12/22 1508   ?  ? ?Chief Complaint  ?Patient presents with  ? Diarrhea  ? Back Pain  ? Abdominal Pain  ? ?Ms. Vicki Daniel is a 26 y.o. year old G48P0030 female at [redacted]w[redacted]d weeks gestation who presents to MAU reporting upper mid abdominal pain (rated 8/10) and RT mid back pain (rated 5/10) since 0400. She also complains of 1 episode of diarrhea @ 0700. She thinks it is from the veggie sushi she ate last night. She also reports generalized body aches x 30 mins before arriving to MAU; rated 5/10. 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 She has been using the cream for 2 days and wonders if it causing her sx's, but when she called her OB office they assured her that is not the cause. She reports she has had 3 bottles of H2O to drink today and 1 cup of Activia yogurt this morning at 0830. She was able to work this morning, but left early to come to MAU for evaluation. [facility]; next appt is 04/15/2022.  ? ? ?OB History   ? ? Gravida  ?4  ? Para  ?0  ? Term  ?0  ? Preterm  ?0  ? AB  ?3  ? Living  ?0  ?  ? ? SAB  ?2  ? IAB  ?1  ? Ectopic  ?0  ? Multiple  ?0  ? Live Births  ?0  ?   ?  ?  ? ? ?Past Medical History:  ?Diagnosis Date  ? Asthma   ? ? ?Past Surgical History:  ?Procedure Laterality Date  ? WISDOM TOOTH EXTRACTION    ? ? ?Family History  ?Problem Relation Age of Onset  ? Hypertension Father   ? ? ?Social History  ? ?Tobacco Use  ? Smoking status: Never  ? Smokeless tobacco: Never  ?Vaping Use  ? Vaping Use: Former  ?Substance Use Topics  ? Alcohol use: Not Currently  ? Drug use: Not Currently  ?  Types: Marijuana  ?  Comment: stopped a week before found out pregnant  ? ? ?Allergies: No Known Allergies ? ?Medications Prior to Admission  ?Medication Sig Dispense Refill Last Dose  ? albuterol (VENTOLIN  HFA) 108 (90 Base) MCG/ACT inhaler Use 2 puffs every 4-6 hours as needed for wheezing. 18 g 0 04/12/2022  ? Prenatal Vit-Fe Fumarate-FA (PREPLUS) 27-1 MG TABS Take by mouth.   04/11/2022  ? cefadroxil (DURICEF) 500 MG capsule Take 1 capsule (500 mg total) by mouth 2 (two) times daily. 14 capsule 0   ? fluticasone (FLONASE) 50 MCG/ACT nasal spray Place 2 sprays into both nostrils daily. 16 g 12   ? metroNIDAZOLE (METROGEL) 0.75 % vaginal gel Place 1 Applicatorful vaginally 2 (two) times daily.     ? Oyster Shell (OYSTER CALCIUM) 500 MG TABS tablet Take 500 mg of elemental calcium by mouth 2 (two) times daily.     ? ? ?Review of Systems  ?Constitutional: Negative.   ?HENT: Negative.    ?Eyes: Negative.   ?Respiratory: Negative.    ?Cardiovascular: Negative.   ?Gastrointestinal:  Positive for abdominal pain, diarrhea (x1 episode), nausea and vomiting.  ?Endocrine: Negative.   ?Genitourinary:  Vaginal discharge: currently being treated for recurrent BV.  ?Musculoskeletal:  Positive for  back pain and myalgias (generalized body aches x 30 mins prior to arrival in MAU).  ?Skin: Negative.   ?Allergic/Immunologic: Negative.   ?Neurological: Negative.   ?Hematological: Negative.   ?Psychiatric/Behavioral: Negative.    ?Physical Exam  ? ?Blood pressure 109/67, pulse (!) 103, temperature 98.7 ?F (37.1 ?C), temperature source Oral, resp. rate 17, height 5\' 9"  (1.753 m), weight 106.7 kg, last menstrual period 09/28/2021, SpO2 98 %, unknown if currently breastfeeding. ? ?Physical Exam ?Vitals and nursing note reviewed.  ?Constitutional:   ?   Appearance: Normal appearance. She is obese.  ?Cardiovascular:  ?   Rate and Rhythm: Tachycardia present.  ?Pulmonary:  ?   Effort: Pulmonary effort is normal.  ?Abdominal:  ?   Palpations: Abdomen is soft.  ?   Tenderness: There is no abdominal tenderness. There is no guarding or rebound.  ?Genitourinary: ?   Comments: deferred ?Musculoskeletal:     ?   General: Normal range of motion.  ?Skin: ?    General: Skin is warm and dry.  ?Neurological:  ?   Mental Status: She is alert and oriented to person, place, and time.  ?Psychiatric:     ?   Mood and Affect: Mood normal.     ?   Behavior: Behavior normal.     ?   Thought Content: Thought content normal.     ?   Judgment: Judgment normal.  ? ?REACTIVE NST - FHR: 140 bpm / moderate variability / accels present / occ. variable decels present / TOCO: none  ?MAU Course  ?Procedures ? ?MDM ?CCUA ?LR bolus 1000 mL @ 999 ml/hr ?Pepcid 20 mg IVPB ?Zofran 8 mg IVPB -- N/V resolved ?PO challenge -- patient tolerated saltine crackers and water ? ?Results for orders placed or performed during the hospital encounter of 04/12/22 (from the past 24 hour(s))  ?Urinalysis, Routine w reflex microscopic Urine, Clean Catch     Status: Abnormal  ? Collection Time: 04/12/22  2:34 PM  ?Result Value Ref Range  ? Color, Urine AMBER (A) YELLOW  ? APPearance CLOUDY (A) CLEAR  ? Specific Gravity, Urine 1.027 1.005 - 1.030  ? pH 5.0 5.0 - 8.0  ? Glucose, UA NEGATIVE NEGATIVE mg/dL  ? Hgb urine dipstick NEGATIVE NEGATIVE  ? Bilirubin Urine NEGATIVE NEGATIVE  ? Ketones, ur 80 (A) NEGATIVE mg/dL  ? Protein, ur NEGATIVE NEGATIVE mg/dL  ? Nitrite NEGATIVE NEGATIVE  ? Leukocytes,Ua NEGATIVE NEGATIVE  ?  ?Assessment and Plan  ?Gastroenteritis ?- Information provided on N/V in adults, diarrhea, food choices for diarrhea ?- Advised to eat a bland diet.  ?- Information provided on bland diet. ?  ?[redacted] weeks gestation of pregnancy  ?- Letter to return to work tomorrow without restrictions ? ?- Discharge Home ?- Keep scheduled appt with [facility] on Friday 04/15/22 ?Patient verbalized an understanding of the plan of care and agrees.  ? ?06/15/22, CNM ?04/12/2022, 3:12 PM  ?

## 2022-04-12 NOTE — Discharge Instructions (Signed)
The 'BRAT' diet is suggested, then progress to diet as tolerated as symptoms abate. Call if bloody stools, persistent diarrhea, vomiting, fever or abdominal pain.

## 2022-04-12 NOTE — MAU Note (Addendum)
...  Vicki Daniel is a 26 y.o. at [redacted]w[redacted]d here in MAU reporting: Upper to mid medial abdominal pain that has been ongoing since 0400 this morning as well as mid right back pain that began at noon today. She reports she had one episode of diarrhea this morning at 0700. She reports she thinks it is from the veggie sushi she ate last night. She reports she has also had recurrent BV in her pregnancy and was prescribed vaginal Clindamycin cream two days ago and had two doses and is wondering if this caused her diarrhea. She reports 30 minutes ago she started experiencing generalized body aches. Denies VB or LOF. +FM.  ? ?She reports she has drank 3 bottles of water today and she ate one cup of Activia yogurt this morning at 0830. ? ?Was seen at Urgent Care this past Sunday for shortness of breath, runny nose, and cough. She reports she was given Zyrtec. ? ?Pain score:  ?8/10 upper to mid medial abdominal pain ?5/10 mid back on the right side ?5/10 generalized body aches ? ?FHT: 138 initial external ?Lab orders placed from triage:  UA ? ?

## 2022-04-21 NOTE — Progress Notes (Deleted)
Cardio-Obstetrics Clinic  New Evaluation  Date:  04/21/2022   ID:  Vicki Daniel, DOB 22-Sep-1996, MRN [id number]  PCP:  Ferd Hibbs, NP   [facility] Providers Cardiologist:  None  Electrophysiologist:  None       Referring MD: Ferd Hibbs, NP   Chief Complaint: Palpitations  History of Present Illness:    Vicki Daniel is a 26 y.o. female [G4P0030] who is being seen today for the evaluation of palpitations at the request of Ferd Hibbs, NP.   Today, ***   Prior CV Studies Reviewed: The following studies were reviewed today: ***  Past Medical History:  Diagnosis Date   Asthma     Past Surgical History:  Procedure Laterality Date   WISDOM TOOTH EXTRACTION     { Click here to update PMH, PSH, OB Hx then refresh note  :1}   OB History     Gravida  4   Para  0   Term  0   Preterm  0   AB  3   Living  0      SAB  2   IAB  1   Ectopic  0   Multiple  0   Live Births  0           { Click here to update OB Charting then refresh note  :1}    Current Medications: No outpatient medications have been marked as taking for the 04/22/22 encounter (Appointment) with Freada Bergeron, MD.     Allergies:   Patient has no known allergies.   Social History   Socioeconomic History   Marital status: Single    Spouse name: Not on file   Number of children: Not on file   Years of education: Not on file   Highest education level: Not on file  Occupational History   Not on file  Tobacco Use   Smoking status: Never   Smokeless tobacco: Never  Vaping Use   Vaping Use: Former  Substance and Sexual Activity   Alcohol use: Not Currently   Drug use: Not Currently    Types: Marijuana    Comment: stopped a week before found out pregnant   Sexual activity: Not Currently    Birth control/protection: None  Other Topics Concern   Not on file  Social History Narrative   Not on file   Social Determinants of Health   Financial  Resource Strain: Not on file  Food Insecurity: Not on file  Transportation Needs: Not on file  Physical Activity: Not on file  Stress: Not on file  Social Connections: Not on file  { Click here to update SDOH then refresh :1}    Family History  Problem Relation Age of Onset   Hypertension Father    { Click here to update FH then refresh note    :1}   ROS:   Please see the history of present illness.    *** All other systems reviewed and are negative.   Labs/EKG Reviewed:    EKG:   EKG is *** ordered today.  The ekg ordered today demonstrates ***  Recent Labs: 03/18/2022: ALT 17; BUN 5; Creatinine, Ser 0.59; Hemoglobin 12.5; Platelets 233; Potassium 3.4; Sodium 136   Recent Lipid Panel No results found for: CHOL, TRIG, HDL, CHOLHDL, LDLCALC, LDLDIRECT  Physical Exam:    VS:  LMP 09/28/2021     Wt Readings from Last 3 Encounters:  04/12/22 235 lb 3.2 oz (106.7 kg)  04/10/22  231 lb (104.8 kg)  03/18/22 232 lb (105.2 kg)     GEN: *** Well nourished, well developed in no acute distress HEENT: Normal NECK: No JVD; No carotid bruits LYMPHATICS: No lymphadenopathy CARDIAC: ***RRR, no murmurs, rubs, gallops RESPIRATORY:  Clear to auscultation without rales, wheezing or rhonchi  ABDOMEN: Soft, non-tender, non-distended MUSCULOSKELETAL:  No edema; No deformity  SKIN: Warm and dry NEUROLOGIC:  Alert and oriented x 3 PSYCHIATRIC:  Normal affect    Risk Assessment/Risk Calculators:   { Click to calculate CARPREG II - THEN refresh note :1}    { Click to caclulate Mod WHO Class of CV Risk - THEN refresh note :1}     { Click for [id number] Score - THEN Refresh Note    :[id number]      ASSESSMENT & PLAN:    #Palpitations: -Check zio monitor There are no Patient Instructions on file for this visit.   Dispo:  No follow-ups on file.   Medication Adjustments/Labs and Tests Ordered: Current medicines are reviewed at length with the patient today.  Concerns  regarding medicines are outlined above.  Tests Ordered: No orders of the defined types were placed in this encounter.  Medication Changes: No orders of the defined types were placed in this encounter.

## 2022-04-22 ENCOUNTER — Ambulatory Visit: Payer: Medicaid Other | Admitting: Cardiology

## 2022-04-22 DIAGNOSIS — Z6791 Unspecified blood type, Rh negative: Secondary | ICD-10-CM | POA: Diagnosis not present

## 2022-04-22 DIAGNOSIS — Z3A29 29 weeks gestation of pregnancy: Secondary | ICD-10-CM | POA: Diagnosis not present

## 2022-04-22 DIAGNOSIS — Z3483 Encounter for supervision of other normal pregnancy, third trimester: Secondary | ICD-10-CM | POA: Diagnosis not present

## 2022-04-22 DIAGNOSIS — Z3689 Encounter for other specified antenatal screening: Secondary | ICD-10-CM | POA: Diagnosis not present

## 2022-04-22 LAB — OB RESULTS CONSOLE RPR: RPR: NONREACTIVE

## 2022-05-23 DIAGNOSIS — Z113 Encounter for screening for infections with a predominantly sexual mode of transmission: Secondary | ICD-10-CM | POA: Diagnosis not present

## 2022-06-17 DIAGNOSIS — Z3685 Encounter for antenatal screening for Streptococcus B: Secondary | ICD-10-CM | POA: Diagnosis not present

## 2022-06-17 LAB — OB RESULTS CONSOLE GBS: GBS: POSITIVE

## 2022-07-06 ENCOUNTER — Encounter (HOSPITAL_COMMUNITY): Payer: Self-pay | Admitting: Obstetrics and Gynecology

## 2022-07-06 ENCOUNTER — Inpatient Hospital Stay (HOSPITAL_COMMUNITY)
Admission: AD | Admit: 2022-07-06 | Discharge: 2022-07-06 | Disposition: A | Discharge status: Home / Self Care | Payer: Medicaid Other | Attending: Obstetrics and Gynecology | Admitting: Obstetrics and Gynecology

## 2022-07-06 DIAGNOSIS — R1031 Right lower quadrant pain: Secondary | ICD-10-CM | POA: Insufficient documentation

## 2022-07-06 DIAGNOSIS — Z3A39 39 weeks gestation of pregnancy: Secondary | ICD-10-CM | POA: Insufficient documentation

## 2022-07-06 DIAGNOSIS — O26893 Other specified pregnancy related conditions, third trimester: Secondary | ICD-10-CM

## 2022-07-06 DIAGNOSIS — Z113 Encounter for screening for infections with a predominantly sexual mode of transmission: Secondary | ICD-10-CM | POA: Diagnosis not present

## 2022-07-06 DIAGNOSIS — R1032 Left lower quadrant pain: Secondary | ICD-10-CM | POA: Diagnosis not present

## 2022-07-06 DIAGNOSIS — R109 Unspecified abdominal pain: Secondary | ICD-10-CM | POA: Diagnosis not present

## 2022-07-06 DIAGNOSIS — O26899 Other specified pregnancy related conditions, unspecified trimester: Secondary | ICD-10-CM

## 2022-07-06 LAB — URINALYSIS, ROUTINE W REFLEX MICROSCOPIC
Bilirubin Urine: NEGATIVE
Glucose, UA: >=500 mg/dL — AB
Hgb urine dipstick: NEGATIVE
Ketones, ur: NEGATIVE mg/dL
Leukocytes,Ua: NEGATIVE
Nitrite: NEGATIVE
Protein, ur: NEGATIVE mg/dL
Specific Gravity, Urine: 1.017 (ref 1.005–1.030)
pH: 6 (ref 5.0–8.0)

## 2022-07-06 LAB — WET PREP, GENITAL
Clue Cells Wet Prep HPF POC: NONE SEEN
Sperm: NONE SEEN
Trich, Wet Prep: NONE SEEN
WBC, Wet Prep HPF POC: <10 (ref <10)
Yeast Wet Prep HPF POC: NONE SEEN

## 2022-07-06 LAB — GLUCOSE, CAPILLARY: Glucose-Capillary: 103 mg/dL — ABNORMAL HIGH (ref 70–99)

## 2022-07-06 LAB — HIV ANTIBODY (ROUTINE TESTING W REFLEX): HIV Screen 4th Generation wRfx: NONREACTIVE

## 2022-07-06 NOTE — MAU Provider Note (Signed)
History     CSN: [id number]  Arrival date and time: 07/06/22 1249   Event Date/Time   First Provider Initiated Contact with Patient 07/06/22 1322      Chief Complaint  Patient presents with  . Abdominal Pain   Patient presenting today for evaluation for multiple complaints.  Her first complaint is that she is having upper abdominal pain over the past few days.  She reports that it feels like gas pain and will resolve spontaneously.  She also is reporting lower abdominal pain which is worse on the left than the right.  It is down in her groin.  Reports increased urination but no dysuria.  Reports an abnormal odor from her vaginal discharge and request STD screening because she was sexually active 3 weeks ago and would like to make sure she does not have any CDs.  She has not been tested since being sexually active.  Reports fetal movement she felt like was decreased yesterday but is normal today.  Denies any gush of fluid or vaginal bleeding.  Denies any contractions.  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  She reports that she was taking her Valtrex as prescribed but approximately 1 week ago discontinued it because she felt like when she took it the baby's movement decreased.  Reports that her last outbreak was years ago and denies any symptoms of an outbreak at this time but would like to be inspected for any lesions.   OB History     Gravida  4   Para  0   Term  0   Preterm  0   AB  3   Living  0      SAB  2   IAB  1   Ectopic  0   Multiple  0   Live Births  0           Past Medical History:  Diagnosis Date  . Asthma     Past Surgical History:  Procedure Laterality Date  . WISDOM TOOTH EXTRACTION      Family History  Problem Relation Age of Onset  . Hypertension Father     Social History   Tobacco Use  . Smoking status: Never  . Smokeless tobacco: Never  Vaping Use  . Vaping Use: Former  Substance Use Topics  .  Alcohol use: Not Currently  . Drug use: Not Currently    Types: Marijuana    Comment: stopped a week before found out pregnant    Allergies: No Known Allergies  Medications Prior to Admission  Medication Sig Dispense Refill Last Dose  . albuterol (VENTOLIN HFA) 108 (90 Base) MCG/ACT inhaler Use 2 puffs every 4-6 hours as needed for wheezing. 18 g 0   . cefadroxil (DURICEF) 500 MG capsule Take 1 capsule (500 mg total) by mouth 2 (two) times daily. 14 capsule 0   . fluticasone (FLONASE) 50 MCG/ACT nasal spray Place 2 sprays into both nostrils daily. 16 g 12   . metroNIDAZOLE (METROGEL) 0.75 % vaginal gel Place 1 Applicatorful vaginally 2 (two) times daily.     Ethelda Chick (OYSTER CALCIUM) 500 MG TABS tablet Take 500 mg of elemental calcium by mouth 2 (two) times daily.     . Prenatal Vit-Fe Fumarate-FA (PREPLUS) 27-1 MG TABS Take by mouth.       Review of Systems  Constitutional:  Negative for chills and fever.  HENT:  Negative for congestion and rhinorrhea.  Gastrointestinal:  Positive for abdominal pain. Negative for constipation, diarrhea, nausea and vomiting.  Endocrine: Positive for polyuria.  Genitourinary:  Positive for frequency and vaginal discharge. Negative for difficulty urinating, dyspareunia, pelvic pain, urgency and vaginal bleeding.   Physical Exam   Blood pressure 132/80, pulse 98, temperature 98 F (36.7 C), temperature source Oral, resp. rate 18, height 5\' 9"  (1.753 m), weight 120.1 kg, last menstrual period 10/01/2021, SpO2 98 %, unknown if currently breastfeeding.  Physical Exam Constitutional:      Appearance: She is well-developed.  HENT:     Head: Normocephalic and atraumatic.     Mouth/Throat:     Mouth: Mucous membranes are moist.  Cardiovascular:     Rate and Rhythm: Normal rate and regular rhythm.  Pulmonary:     Effort: Pulmonary effort is normal.     Breath sounds: Normal breath sounds.  Abdominal:     General: Bowel sounds are normal.      Tenderness: There is abdominal tenderness in the epigastric area, suprapubic area and left lower quadrant. There is no right CVA tenderness or left CVA tenderness.     Hernia: No hernia is present.     Comments: Gravid  Genitourinary:    Vagina: Vaginal discharge present. No tenderness or bleeding.     Cervix: Dilated.     Comments: No signs of HSV outbreak on physical exam externally or internally Skin:    General: Skin is warm.     Capillary Refill: Capillary refill takes less than 2 seconds.  Neurological:     Mental Status: She is alert.    MAU Course  Procedures  MDM Urinalysis collected showing Wet prep showing no yeast, trichomoniasis, clue cells GC/chlamydia collected today HIV/RPR collected  Assessment and Plan  Abdominal pain - UA reassuring with no signs of infection  - STD screening collected and wet prep negative  - Upper abdominal pain appears to be gas related, discussed symptomatic management - Lower abdominal pain appears to be round ligament pain, discussed support belt   Hx HSV  No signs of outbreak at this time.  Discussed importance of taking her Valtrex and discussing this with her OB provider which she reports she has appointment with tomorrow.  Patient indicates she will reinitiate Valtrex after being discharged today  [redacted] weeks gestation of pregnancy - Patient has appointment with [facility] tomorrow, recommended she be discharged to go to this appointment - Discussed return precautions - Patient discharged  [facility] 07/06/2022, 2:28 PM

## 2022-07-06 NOTE — Discharge Instructions (Addendum)
It was a pleasure taking care of you today. I believe your abdominal pain is related to round ligament pain. I recommend using a pregnancy belt to help decrease the symptoms.  We did evaluate your urine which did not indicate any signs of infection.  We also collected for routine STI screening.  Be sure to follow-up with your primary OB provider and restart the medications you are supposed to be taking.  If you have any vaginal bleeding, gush of fluid, regular contractions please return for further evaluation.  I hope you have a wonderful day!

## 2022-07-06 NOTE — MAU Note (Signed)
Vicki Daniel is a 26 y.o. at 101w1d here in MAU reporting: been havng some pain since yesterday, all over her abd, pain is constant.  Upper part feels like tightness or gas.  The lower part is not hurting as much as it was. No bleeding or leaking. +FM/   Onset of complaint: yesterday Pain score: 7 Vitals:   07/06/22 1304  BP: 132/80  Pulse: 98  Resp: 18  Temp: 98 F (36.7 C)  SpO2: 98%     FHT:134 Lab orders placed from triage:  none

## 2022-07-07 LAB — RPR: RPR Ser Ql: NONREACTIVE

## 2022-07-07 LAB — GC/CHLAMYDIA PROBE AMP (~~LOC~~) NOT AT ARMC
Chlamydia: NEGATIVE
Comment: NEGATIVE
Comment: NORMAL
Neisseria Gonorrhea: NEGATIVE

## 2022-07-08 ENCOUNTER — Encounter (HOSPITAL_COMMUNITY): Payer: Self-pay

## 2022-07-08 ENCOUNTER — Telehealth (HOSPITAL_COMMUNITY): Payer: Self-pay | Admitting: *Deleted

## 2022-07-08 NOTE — Telephone Encounter (Signed)
Preadmission screen  

## 2022-07-09 ENCOUNTER — Other Ambulatory Visit: Payer: Self-pay | Admitting: Obstetrics and Gynecology

## 2022-07-09 DIAGNOSIS — Z3A40 40 weeks gestation of pregnancy: Secondary | ICD-10-CM

## 2022-07-11 ENCOUNTER — Other Ambulatory Visit: Payer: Self-pay

## 2022-07-11 ENCOUNTER — Encounter (HOSPITAL_COMMUNITY): Payer: Self-pay | Admitting: Obstetrics and Gynecology

## 2022-07-11 ENCOUNTER — Inpatient Hospital Stay (HOSPITAL_COMMUNITY)
Admission: AD | Admit: 2022-07-11 | Discharge: 2022-07-14 | DRG: 807 | Disposition: A | Discharge status: Home / Self Care | Payer: Medicaid Other | Attending: Obstetrics and Gynecology | Admitting: Obstetrics and Gynecology

## 2022-07-11 ENCOUNTER — Inpatient Hospital Stay (HOSPITAL_COMMUNITY): Payer: Medicaid Other

## 2022-07-11 DIAGNOSIS — O26893 Other specified pregnancy related conditions, third trimester: Secondary | ICD-10-CM | POA: Diagnosis not present

## 2022-07-11 DIAGNOSIS — Z3A40 40 weeks gestation of pregnancy: Secondary | ICD-10-CM

## 2022-07-11 DIAGNOSIS — O480 Post-term pregnancy: Secondary | ICD-10-CM | POA: Diagnosis not present

## 2022-07-11 DIAGNOSIS — J45909 Unspecified asthma, uncomplicated: Secondary | ICD-10-CM | POA: Diagnosis not present

## 2022-07-11 DIAGNOSIS — Z141 Cystic fibrosis carrier: Secondary | ICD-10-CM

## 2022-07-11 DIAGNOSIS — O99824 Streptococcus B carrier state complicating childbirth: Secondary | ICD-10-CM | POA: Diagnosis present

## 2022-07-11 DIAGNOSIS — Z6791 Unspecified blood type, Rh negative: Secondary | ICD-10-CM | POA: Diagnosis not present

## 2022-07-11 DIAGNOSIS — O9952 Diseases of the respiratory system complicating childbirth: Secondary | ICD-10-CM | POA: Diagnosis present

## 2022-07-11 HISTORY — DX: 40 weeks gestation of pregnancy: Z3A.40

## 2022-07-11 LAB — CBC
HCT: 41.4 % (ref 36.0–46.0)
Hemoglobin: 13.7 g/dL (ref 12.0–15.0)
MCH: 31.3 pg (ref 26.0–34.0)
MCHC: 33.1 g/dL (ref 30.0–36.0)
MCV: 94.5 fL (ref 80.0–100.0)
Platelets: 243 10*3/uL (ref 150–400)
RBC: 4.38 MIL/uL (ref 3.87–5.11)
RDW: 13.4 % (ref 11.5–15.5)
WBC: 12.5 10*3/uL — ABNORMAL HIGH (ref 4.0–10.5)
nRBC: 0 % (ref 0.0–0.2)

## 2022-07-11 LAB — HEPATITIS B SURFACE ANTIGEN: Hepatitis B Surface Ag: NONREACTIVE

## 2022-07-11 MED ORDER — TERBUTALINE SULFATE 1 MG/ML IJ SOLN: 0.2500 mg | Freq: Once | INTRAMUSCULAR | Status: DC | PRN

## 2022-07-11 MED ORDER — OXYTOCIN-SODIUM CHLORIDE 30-0.9 UT/500ML-% IV SOLN
1.0000 m[IU]/min | INTRAVENOUS | Status: DC
  Administered 2022-07-12: 2 m[IU]/min via INTRAVENOUS
  Filled 2022-07-11: qty 500

## 2022-07-11 MED ORDER — OXYCODONE-ACETAMINOPHEN 5-325 MG PO TABS: 2.0000 | ORAL_TABLET | ORAL | Status: DC | PRN

## 2022-07-11 MED ORDER — OXYCODONE-ACETAMINOPHEN 5-325 MG PO TABS: 1.0000 | ORAL_TABLET | ORAL | Status: DC | PRN

## 2022-07-11 MED ORDER — LACTATED RINGERS IV SOLN: INTRAVENOUS | Status: DC

## 2022-07-11 MED ORDER — MISOPROSTOL 25 MCG QUARTER TABLET
25.0000 ug | ORAL_TABLET | ORAL | Status: DC | PRN
  Administered 2022-07-11 – 2022-07-12 (×2): 25 ug via VAGINAL
  Filled 2022-07-11 (×2): qty 1

## 2022-07-11 MED ORDER — LIDOCAINE HCL (PF) 1 % IJ SOLN
30.0000 mL | INTRAMUSCULAR | Status: AC | PRN
  Administered 2022-07-13: 30 mL via SUBCUTANEOUS
  Filled 2022-07-11: qty 30

## 2022-07-11 MED ORDER — LACTATED RINGERS IV SOLN
500.0000 mL | INTRAVENOUS | Status: DC | PRN
  Administered 2022-07-13: 500 mL via INTRAVENOUS

## 2022-07-11 MED ORDER — OXYTOCIN-SODIUM CHLORIDE 30-0.9 UT/500ML-% IV SOLN: 2.5000 [IU]/h | INTRAVENOUS | Status: DC

## 2022-07-11 MED ORDER — PENICILLIN G POT IN DEXTROSE 60000 UNIT/ML IV SOLN
3.0000 10*6.[IU] | INTRAVENOUS | Status: DC
  Administered 2022-07-12 – 2022-07-13 (×8): 3 10*6.[IU] via INTRAVENOUS
  Filled 2022-07-11 (×8): qty 50

## 2022-07-11 MED ORDER — OXYTOCIN BOLUS FROM INFUSION
333.0000 mL | Freq: Once | INTRAVENOUS | Status: AC
  Administered 2022-07-13: 333 mL via INTRAVENOUS

## 2022-07-11 MED ORDER — ACETAMINOPHEN 325 MG PO TABS: 650.0000 mg | ORAL_TABLET | ORAL | Status: DC | PRN

## 2022-07-11 MED ORDER — ONDANSETRON HCL 4 MG/2ML IJ SOLN: 4.0000 mg | Freq: Four times a day (QID) | INTRAMUSCULAR | Status: DC | PRN

## 2022-07-11 MED ORDER — SODIUM CHLORIDE 0.9 % IV SOLN
5.0000 10*6.[IU] | Freq: Once | INTRAVENOUS | Status: AC
  Administered 2022-07-11: 5 10*6.[IU] via INTRAVENOUS
  Filled 2022-07-11: qty 5

## 2022-07-11 MED ORDER — SOD CITRATE-CITRIC ACID 500-334 MG/5ML PO SOLN: 30.0000 mL | ORAL | Status: DC | PRN
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
## 2022-07-12 ENCOUNTER — Inpatient Hospital Stay (HOSPITAL_COMMUNITY): Payer: Medicaid Other | Admitting: Anesthesiology

## 2022-07-12 DIAGNOSIS — Z3A40 40 weeks gestation of pregnancy: Secondary | ICD-10-CM | POA: Diagnosis not present

## 2022-07-12 DIAGNOSIS — O9952 Diseases of the respiratory system complicating childbirth: Secondary | ICD-10-CM | POA: Diagnosis not present

## 2022-07-12 DIAGNOSIS — J45909 Unspecified asthma, uncomplicated: Secondary | ICD-10-CM | POA: Diagnosis not present

## 2022-07-12 LAB — TYPE AND SCREEN
ABO/RH(D): A NEG
Antibody Screen: NEGATIVE

## 2022-07-12 LAB — RPR: RPR Ser Ql: NONREACTIVE

## 2022-07-12 MED ORDER — EPHEDRINE 5 MG/ML INJ: 10.0000 mg | INTRAVENOUS | Status: DC | PRN

## 2022-07-12 MED ORDER — PHENYLEPHRINE 80 MCG/ML (10ML) SYRINGE FOR IV PUSH (FOR BLOOD PRESSURE SUPPORT): 80.0000 ug | PREFILLED_SYRINGE | INTRAVENOUS | Status: DC | PRN

## 2022-07-12 MED ORDER — FENTANYL CITRATE (PF) 100 MCG/2ML IJ SOLN
100.0000 ug | INTRAMUSCULAR | Status: DC | PRN
  Administered 2022-07-12: 100 ug via INTRAVENOUS
  Filled 2022-07-12: qty 2

## 2022-07-12 MED ORDER — LIDOCAINE-EPINEPHRINE (PF) 1.5 %-1:200000 IJ SOLN
INTRAMUSCULAR | Status: DC | PRN
  Administered 2022-07-12: 3 mL via EPIDURAL

## 2022-07-12 MED ORDER — MISOPROSTOL 50MCG HALF TABLET
50.0000 ug | ORAL_TABLET | Freq: Once | ORAL | Status: AC
  Administered 2022-07-12: 50 ug via BUCCAL
  Filled 2022-07-12: qty 1

## 2022-07-12 MED ORDER — LACTATED RINGERS IV SOLN: 500.0000 mL | Freq: Once | INTRAVENOUS | Status: DC

## 2022-07-12 MED ORDER — DIPHENHYDRAMINE HCL 50 MG/ML IJ SOLN
12.5000 mg | INTRAMUSCULAR | Status: DC | PRN
  Administered 2022-07-12 – 2022-07-13 (×2): 12.5 mg via INTRAVENOUS
  Filled 2022-07-12: qty 1

## 2022-07-12 MED ORDER — FENTANYL-BUPIVACAINE-NACL 0.5-0.125-0.9 MG/250ML-% EP SOLN
12.0000 mL/h | EPIDURAL | Status: DC | PRN
  Administered 2022-07-12: 12 mL/h via EPIDURAL
  Filled 2022-07-12: qty 250

## 2022-07-12 NOTE — Anesthesia Procedure Notes (Signed)
Epidural Patient location during procedure: OB Start time: 07/12/2022 5:41 PM End time: 07/12/2022 6:02 PM  Staffing Anesthesiologist: Lewie Loron, MD Performed: anesthesiologist   Preanesthetic Checklist Completed: patient identified, IV checked, risks and benefits discussed, monitors and equipment checked, pre-op evaluation and timeout performed  Epidural Patient position: sitting Prep: DuraPrep and site prepped and draped Patient monitoring: heart rate, continuous pulse ox and blood pressure Approach: midline Location: L2-L3 Injection technique: LOR air and LOR saline  Needle:  Needle type: Tuohy  Needle gauge: 17 G Needle length: 9 cm Needle insertion depth: 6.5 cm Catheter type: closed end flexible Catheter size: 19 Gauge Catheter at skin depth: 13 cm Test dose: negative  Assessment Sensory level: T8 Events: blood not aspirated, injection not painful, no injection resistance, no paresthesia and negative IV test  Additional Notes Reason for block:procedure for pain

## 2022-07-12 NOTE — Anesthesia Preprocedure Evaluation (Signed)
Anesthesia Evaluation  Patient identified by MRN, date of birth, ID band Patient awake    Reviewed: Allergy & Precautions, Patient's Chart, lab work & pertinent test results  Airway Mallampati: II  TM Distance: >3 FB Neck ROM: Full    Dental no notable dental hx.    Pulmonary asthma ,    Pulmonary exam normal breath sounds clear to auscultation       Cardiovascular negative cardio ROS Normal cardiovascular exam Rhythm:Regular Rate:Normal     Neuro/Psych negative neurological ROS     GI/Hepatic negative GI ROS, Neg liver ROS,   Endo/Other  Morbid obesity  Renal/GU negative Renal ROS     Musculoskeletal negative musculoskeletal ROS (+)   Abdominal (+) + obese,   Peds  Hematology negative hematology ROS (+)   Anesthesia Other Findings   Reproductive/Obstetrics                             Anesthesia Physical Anesthesia Plan  ASA: 3  Anesthesia Plan: Epidural   Post-op Pain Management:    Induction:   PONV Risk Score and Plan:   Airway Management Planned:   Additional Equipment:   Intra-op Plan:   Post-operative Plan:   Informed Consent: I have reviewed the patients History and Physical, chart, labs and discussed the procedure including the risks, benefits and alternatives for the proposed anesthesia with the patient or authorized representative who has indicated his/her understanding and acceptance.       Plan Discussed with:   Anesthesia Plan Comments:         Anesthesia Quick Evaluation

## 2022-07-12 NOTE — Progress Notes (Signed)
OB Progress Note  S: very comfortable with epidural   O: Today's Vitals   07/12/22 1836 07/12/22 1837 07/12/22 1930 07/12/22 2000  BP:  116/63 114/63 124/72  Pulse:  72 87 84  Resp:   15 14  Temp:      TempSrc:      SpO2: 99%     Weight:      Height:      PainSc:   0-No pain 0-No pain   Body mass index is 39.33 kg/m.  SVE by RN at 1830: 2.5/60/-2  FHR: 125bpm, mod variability, + accels, no decels Toco: ctx q 2-3    A/P: 26Y G4P0030 @ [redacted]w[redacted]d, IOL post dates Fetal wellbeing: cat I tracing IOL: s/p cytotec x 4, s/p AROM at 1420 with IUPC placement. Continue pitocin.  Pain control: epidural GBS pos: penicillin  M. Timothy Lasso, MD 07/12/22 8:16 PM

## 2022-07-12 NOTE — Progress Notes (Signed)
Patient with occasional cramping but otherwise feels well BP 105/60   Pulse 78   Temp 98.4 F (36.9 C) (Oral)   Resp 14   Ht 5\' 9"  (1.753 m)   Wt 120.8 kg   LMP 10/01/2021   BMI 39.33 kg/m  CE 1--2/50/-3, more mid-position than before Cat 1 tracing, TOCO irregular  S/p PV cytotec x2. Discussed FB vs repeat dose cytotec. Will do 10/03/2021 buccally and reassess ability to start pit/AROM. Patient would be amenable to FB after this if needed

## 2022-07-13 ENCOUNTER — Encounter (HOSPITAL_COMMUNITY): Payer: Self-pay | Admitting: Obstetrics and Gynecology

## 2022-07-13 DIAGNOSIS — Z3A40 40 weeks gestation of pregnancy: Secondary | ICD-10-CM | POA: Diagnosis not present

## 2022-07-13 LAB — RUBELLA SCREEN: Rubella: 3.52 index (ref >0.99)

## 2022-07-13 MED ORDER — MAGNESIUM HYDROXIDE 400 MG/5ML PO SUSP: 30.0000 mL | ORAL | Status: DC | PRN

## 2022-07-13 MED ORDER — DIPHENHYDRAMINE HCL 25 MG PO CAPS: 25.0000 mg | ORAL_CAPSULE | Freq: Four times a day (QID) | ORAL | Status: DC | PRN

## 2022-07-13 MED ORDER — ALBUTEROL SULFATE HFA 108 (90 BASE) MCG/ACT IN AERS: 2.0000 | INHALATION_SPRAY | Freq: Four times a day (QID) | RESPIRATORY_TRACT | Status: DC | PRN

## 2022-07-13 MED ORDER — ONDANSETRON HCL 4 MG/2ML IJ SOLN: 4.0000 mg | INTRAMUSCULAR | Status: DC | PRN

## 2022-07-13 MED ORDER — METHYLERGONOVINE MALEATE 0.2 MG/ML IJ SOLN: 0.2000 mg | INTRAMUSCULAR | Status: DC | PRN

## 2022-07-13 MED ORDER — IBUPROFEN 600 MG PO TABS
600.0000 mg | ORAL_TABLET | Freq: Four times a day (QID) | ORAL | Status: DC
Start: 2022-07-13 — End: 2022-07-14
  Administered 2022-07-13 – 2022-07-14 (×5): 600 mg via ORAL
  Filled 2022-07-13 (×5): qty 1

## 2022-07-13 MED ORDER — WITCH HAZEL-GLYCERIN EX PADS: 1.0000 | MEDICATED_PAD | CUTANEOUS | Status: DC | PRN

## 2022-07-13 MED ORDER — METHYLERGONOVINE MALEATE 0.2 MG PO TABS: 0.2000 mg | ORAL_TABLET | ORAL | Status: DC | PRN

## 2022-07-13 MED ORDER — DIBUCAINE (PERIANAL) 1 % EX OINT: 1.0000 | TOPICAL_OINTMENT | CUTANEOUS | Status: DC | PRN

## 2022-07-13 MED ORDER — ZOLPIDEM TARTRATE 5 MG PO TABS: 5.0000 mg | ORAL_TABLET | Freq: Every evening | ORAL | Status: DC | PRN

## 2022-07-13 MED ORDER — OXYCODONE HCL 5 MG PO TABS: 5.0000 mg | ORAL_TABLET | ORAL | Status: DC | PRN

## 2022-07-13 MED ORDER — SENNOSIDES-DOCUSATE SODIUM 8.6-50 MG PO TABS
2.0000 | ORAL_TABLET | Freq: Every day | ORAL | Status: DC
  Administered 2022-07-14: 2 via ORAL
  Filled 2022-07-13: qty 2

## 2022-07-13 MED ORDER — OXYCODONE HCL 5 MG PO TABS
10.0000 mg | ORAL_TABLET | ORAL | Status: DC | PRN
  Administered 2022-07-13 – 2022-07-14 (×4): 10 mg via ORAL
  Filled 2022-07-13 (×4): qty 2

## 2022-07-13 MED ORDER — MEASLES, MUMPS & RUBELLA VAC IJ SOLR: 0.5000 mL | Freq: Once | INTRAMUSCULAR | Status: DC

## 2022-07-13 MED ORDER — SIMETHICONE 80 MG PO CHEW: 80.0000 mg | CHEWABLE_TABLET | ORAL | Status: DC | PRN

## 2022-07-13 MED ORDER — PRENATAL MULTIVITAMIN CH
1.0000 | ORAL_TABLET | Freq: Every day | ORAL | Status: DC
  Administered 2022-07-13 – 2022-07-14 (×2): 1 via ORAL
  Filled 2022-07-13 (×2): qty 1

## 2022-07-13 MED ORDER — TETANUS-DIPHTH-ACELL PERTUSSIS 5-2.5-18.5 LF-MCG/0.5 IM SUSY: 0.5000 mL | PREFILLED_SYRINGE | Freq: Once | INTRAMUSCULAR | Status: DC

## 2022-07-13 MED ORDER — COCONUT OIL OIL: 1.0000 | TOPICAL_OIL | Status: DC | PRN

## 2022-07-13 MED ORDER — BENZOCAINE-MENTHOL 20-0.5 % EX AERO
1.0000 | INHALATION_SPRAY | CUTANEOUS | Status: DC | PRN
  Administered 2022-07-13: 1 via TOPICAL
  Filled 2022-07-13: qty 56

## 2022-07-13 MED ORDER — ACETAMINOPHEN 325 MG PO TABS
650.0000 mg | ORAL_TABLET | ORAL | Status: DC | PRN
  Administered 2022-07-14 (×2): 650 mg via ORAL
  Filled 2022-07-13 (×2): qty 2

## 2022-07-13 MED ORDER — ONDANSETRON HCL 4 MG PO TABS: 4.0000 mg | ORAL_TABLET | ORAL | Status: DC | PRN

## 2022-07-13 NOTE — Progress Notes (Signed)
OB Progress Note  Called by RN re: run of recurrent late decels and prolonged decel.  Upon my arrival to room, fetal heart rate returning to baseline with patient on hands and knees, IV fluids bolused, and pitocin stopped. Total prolonged decel 4 mins with nadir in 80s.   Had been having small variables with contractions just prior. RN had checked patient at 0230 6.5/90/-1. Recheck by me at 0328: 8/100/+1. Patient feeling a lot of pressure.   O: Today's Vitals   07/12/22 2330 07/13/22 0000 07/13/22 0251 07/13/22 0302  BP: (!) 114/53 107/65  (!) 108/58  Pulse: 75 80  79  Resp: 15 16    Temp:  97.7 F (36.5 C) 99 F (37.2 C)   TempSrc:  Oral Oral   SpO2:      Weight:      Height:      PainSc:  0-No pain     Body mass index is 39.33 kg/m.    A/P: 26Y G4P0030 @ [redacted]w[redacted]d, IOL post dates Fetal wellbeing: category 2 tracing now recovered IOL: s/p cytotec x 4, s/p AROM at 1420 with IUPC placement. Now in active stage. Pitocin currently off due to need for intrauterine resuscitation. Will restart in 30 mins if fetal tracing allows.  Pain control: epidural GBS pos: penicillin  M. Timothy Lasso, MD 07/13/22 3:41 AM

## 2022-07-13 NOTE — Lactation Note (Addendum)
This note was copied from a baby's chart. Lactation Consultation Note  Patient Name: Vicki Daniel [id number] Date: 07/13/2022 Reason for consult: Initial assessment;Mother's request;Primapara;1st time breastfeeding;Term;Breastfeeding assistance Age:25 hours  Infant arching back not able to sustain the latch. Infant also holding tongue back. LC gave colostrum on spoon. Parents left STS, hand expressing to offer colostrum.   Plan 1. To feed by cues 8-12x 24hr period. BP to offer breasts and look for signs of milk transfer.  2. If unable to latch, BP to offer colostrum 5-7 ml per feeding.  All questions answered at the end of the visit.   BP has manual pump at home. BP working on [employer] certification.   Maternal Data Has patient been taught Hand Expression?: Yes Does the patient have breastfeeding experience prior to this delivery?: No  Feeding Mother's Current Feeding Choice: Breast Milk  LATCH Score Latch: Repeated attempts needed to sustain latch, nipple held in mouth throughout feeding, stimulation needed to elicit sucking reflex.  Audible Swallowing: None  Type of Nipple: Everted at rest and after stimulation  Comfort (Breast/Nipple): Soft / non-tender  Hold (Positioning): Assistance needed to correctly position infant at breast and maintain latch.  LATCH Score: 6   Lactation Tools Discussed/Used    Interventions Interventions: Breast feeding basics reviewed;Assisted with latch;Skin to skin;Breast massage;Hand express;Breast compression;Adjust position;Support pillows;Position options;Expressed milk;Education;LC Psychologist, educational;Infant Driven Feeding Algorithm education  Discharge Pump: Manual WIC Program: No  Consult Status Consult Status: Follow-up Date: 07/14/22 Follow-up type: In-patient    Vicki Daniel  Vicki Daniel 07/13/2022, 4:43 PM

## 2022-07-14 MED ORDER — IBUPROFEN 600 MG PO TABS: 600.0000 mg | ORAL_TABLET | Freq: Four times a day (QID) | ORAL | 0 refills | Status: DC | PRN
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
## 2022-07-14 NOTE — Discharge Summary (Signed)
Postpartum Discharge Summary      Patient Name: Vicki Daniel DOB: 20-Jan-1996 MRN: [id number]  Date of admission: 07/11/2022 Delivery date:07/13/2022  Delivering provider: Jackelyn Knife, TODD  Date of discharge: 07/14/2022  Admitting diagnosis: [redacted] weeks gestation of pregnancy [Z3A.40] Intrauterine pregnancy: [redacted]w[redacted]d     Secondary diagnosis:  Principal Problem:   [redacted] weeks gestation of pregnancy  Additional problems: Postterm pregnancy    Discharge diagnosis: Term Pregnancy Delivered                                              Post partum procedures: NA Augmentation: AROM, Pitocin, and Cytotec Complications: None  Hospital course: Induction of Labor With Vaginal Delivery   26 y.o. yo [phone] at [redacted]w[redacted]d was admitted to the hospital 07/11/2022 for induction of labor.  Indication for induction: Postdates.  Patient had an uncomplicated labor course as follows: Membrane Rupture Time/Date: 2:20 PM ,07/12/2022   Delivery Method:Vaginal, Spontaneous  Episiotomy: None  Lacerations:  2nd degree  Details of delivery can be found in separate delivery note.  Patient had a routine postpartum course. Patient is discharged home 07/14/22.  Newborn Data: Birth date:07/13/2022  Birth time:10:12 AM  Gender:Female  Living status:Living  Apgars:8 ,9  Weight:3420 g   Magnesium Sulfate received: No BMZ received: No Rhophylac:N/A  Physical exam  Vitals:   07/13/22 1754 07/13/22 2200 07/14/22 0219 07/14/22 0540  BP: 121/82 (!) 108/44 124/73 99/82  Pulse: 61 65 80 72  Resp: 18 20 20 20   Temp: 98 F (36.7 C) 98.7 F (37.1 C) 97.6 F (36.4 C) 97.6 F (36.4 C)  TempSrc: Oral Oral Oral   SpO2:  100% 100% 100%  Weight:      Height:       General: alert, cooperative, and no distress Lochia: appropriate Uterine Fundus: firm DVT Evaluation: No evidence of DVT seen on physical exam. Labs: Lab Results  Component Value Date   WBC 12.5 (H) 07/11/2022   HGB 13.7 07/11/2022   HCT 41.4 07/11/2022   MCV  94.5 07/11/2022   PLT 243 07/11/2022      Latest Ref Rng & Units 03/18/2022    2:25 PM  CMP  Glucose 70 - 99 mg/dL 96   BUN 6 - 20 mg/dL 5   Creatinine 05/18/2022 - 6.01 mg/dL 0.93   Sodium 2.35 - 573 mmol/L 136   Potassium 3.5 - 5.1 mmol/L 3.4   Chloride 98 - 111 mmol/L 107   CO2 22 - 32 mmol/L 22   Calcium 8.9 - 10.3 mg/dL 8.4   Total Protein 6.5 - 8.1 g/dL 6.0   Total Bilirubin 0.3 - 1.2 mg/dL 0.3   Alkaline Phos 38 - 126 U/L 45   AST 15 - 41 U/L 17   ALT 0 - 44 U/L 17    Edinburgh Score:    07/13/2022   12:49 PM  Edinburgh Postnatal Depression Scale Screening Tool  I have been able to laugh and see the funny side of things. 0  I have looked forward with enjoyment to things. 0  I have blamed myself unnecessarily when things went wrong. 0  I have been anxious or worried for no good reason. 2  I have felt scared or panicky for no good reason. 1  Things have been getting on top of me. 0  I have been so unhappy that  I have had difficulty sleeping. 0  I have felt sad or miserable. 0  I have been so unhappy that I have been crying. 0  The thought of harming myself has occurred to me. 0  Edinburgh Postnatal Depression Scale Total 3      After visit meds:  Allergies as of 07/14/2022   No Known Allergies      Medication List     STOP taking these medications    fluticasone 50 MCG/ACT nasal spray Commonly known as: FLONASE   oyster calcium 500 MG Tabs tablet   valACYclovir 500 MG tablet Commonly known as: VALTREX       TAKE these medications    albuterol 108 (90 Base) MCG/ACT inhaler Commonly known as: VENTOLIN HFA Use 2 puffs every 4-6 hours as needed for wheezing.   ibuprofen 600 MG tablet Commonly known as: ADVIL Take 1 tablet (600 mg total) by mouth every 6 (six) hours as needed.   PrePLUS 27-1 MG Tabs Take by mouth.               Discharge Care Instructions  (From admission, onward)           Start     Ordered   07/14/22 0000  Discharge  wound care:       Comments: For a cesarean delivery: You may wash incision with soap and water.  Do not soak or submerge the incision for 2 weeks. Keep incision dry. You may need to keep a sanitary pad or panty liner between the incision and your clothing for comfort and to keep the incision dry. If you note drainage, increased pain, or increased redness of the incision, then please notify your physician.   07/14/22 1124   07/14/22 0000  If the dressing is still on your incision site when you go home, remove it on the third day after your surgery date. Remove dressing if it begins to fall off, or if it is dirty or damaged before the third day.       Comments: For a cesarean delivery   07/14/22 1124           Desires neonatal circumcision, R/B/A of procedure discussed at length. Pt understands that neonatal circumcision is not considered medically necessary and is elective. The risks include, but are not limited to bleeding, infection, damage to the penis, development of scar tissue, and having to have it redone at a later date. Pt understands theses risks and wishes to proceed   Discharge home in stable condition Infant Feeding: Breast Infant Disposition:home with mother Discharge instruction: per After Visit Summary and Postpartum booklet. Activity: Advance as tolerated. Pelvic rest for 6 weeks.  Diet: routine diet Anticipated Birth Control: Unsure Postpartum Appointment:4 weeks Future Appointments:No future appointments. Follow up Visit:  Follow-up Information     [facility] Follow up in 4 week(s).   Why: For a postpartum evalauation Contact information: [address]  [address] [place] [address] [phone]                     07/14/2022 Waynard Reeds, MD

## 2022-07-14 NOTE — Anesthesia Postprocedure Evaluation (Signed)
Anesthesia Post Note  Patient: Vicki Daniel  Procedure(s) Performed: AN AD HOC LABOR EPIDURAL     Patient location during evaluation: Mother Baby Anesthesia Type: Epidural Level of consciousness: awake Pain management: satisfactory to patient Vital Signs Assessment: post-procedure vital signs reviewed and stable Respiratory status: spontaneous breathing Cardiovascular status: stable Anesthetic complications: no   No notable events documented.  Last Vitals:  Vitals:   07/14/22 0219 07/14/22 0540  BP: 124/73 99/82  Pulse: 80 72  Resp: 20 20  Temp: 36.4 C 36.4 C  SpO2: 100% 100%    Last Pain:  Vitals:   07/14/22 0731  TempSrc:   PainSc: Asleep   Pain Goal:                   [name]

## 2022-07-14 NOTE — Lactation Note (Signed)
This note was copied from a baby's chart. Lactation Consultation Note  Patient Name: Boy Kynzie Polgar [id number] Date: 07/14/2022 Reason for consult: Follow-up assessment Age:25 hours  Baby has been latching only on L breast. Offered to assist with latching on R breast and mother declined. Suggest she offer both breasts per feeding and provided mother with manual pump. Reviewed engorgement care and monitoring voids/stools.  Feeding Mother's Current Feeding Choice: Breast Milk and Formula  LATCH Score Latch: Grasps breast easily, tongue down, lips flanged, rhythmical sucking.  Audible Swallowing: Spontaneous and intermittent  Type of Nipple: Everted at rest and after stimulation  Comfort (Breast/Nipple): Soft / non-tender  Hold (Positioning): No assistance needed to correctly position infant at breast.  LATCH Score: 10   Lactation Tools Discussed/Used Tools: Pump Breast pump type: Manual  Interventions Interventions: Hand pump;Education  Discharge Discharge Education: Engorgement and breast care;Warning signs for feeding baby Pump: Personal  Consult Status Consult Status: Complete Date: 07/14/22    Dahlia Byes [facility] 07/14/2022, 12:04 PM

## 2022-07-23 ENCOUNTER — Telehealth (HOSPITAL_COMMUNITY): Payer: Self-pay

## 2022-07-23 NOTE — Telephone Encounter (Signed)
Patient reports feeling good. Patient declines questions/concerns about her health and healing.  Patient reports that baby is doing well. Eating, peeing/pooping, and gaining weight well. Baby sleeps in a bassinet. RN reviewed ABC's of safe sleep with patient. Patient declines any questions or concerns about baby.  RN provided patient with [facility] lactation resources.   EPDS score is 2.  Marcelino Duster [facility]  07/23/22,1649

## 2022-08-08 IMAGING — US US OB TRANSVAGINAL
2 series · 15 of 28 positions shown · non-contrast
Comparison: 11/02/2021

CLINICAL DATA: LEFT lower quadrant pain for 2 days, pregnant, LMP
09/28/2021

EXAM:
TRANSVAGINAL OB ULTRASOUND
TECHNIQUE: Transvaginal ultrasound was performed for complete evaluation of the
gestation as well as the maternal uterus, adnexal regions, and
pelvic cul-de-sac.

[Series 1: us ob transvaginal · 14 of 37 slices shown (1 of 2)]
[im 1/37]
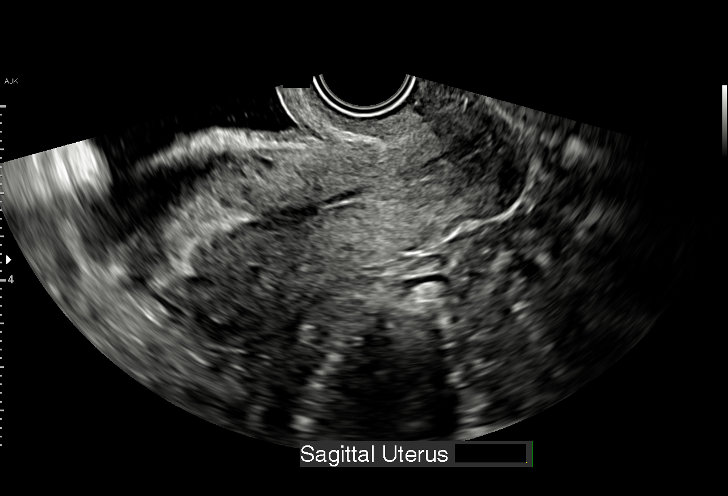
[im 3/37]
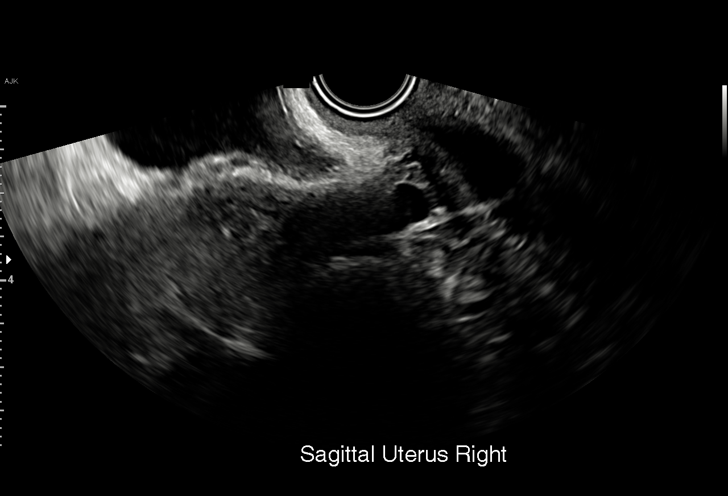
[im 6/37]
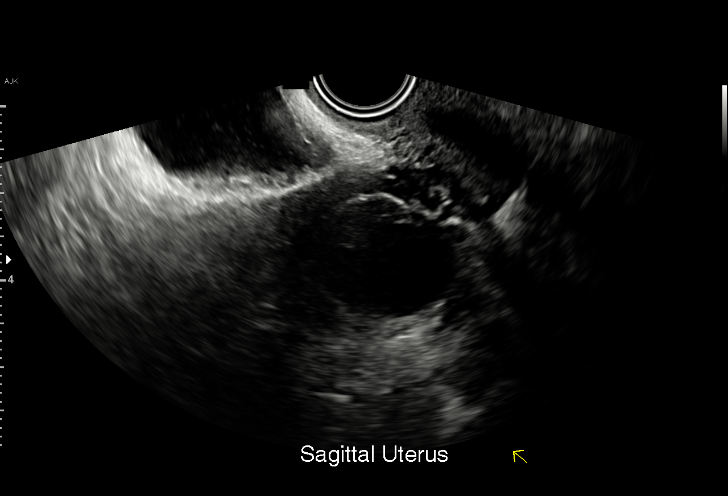
[im 9/37]
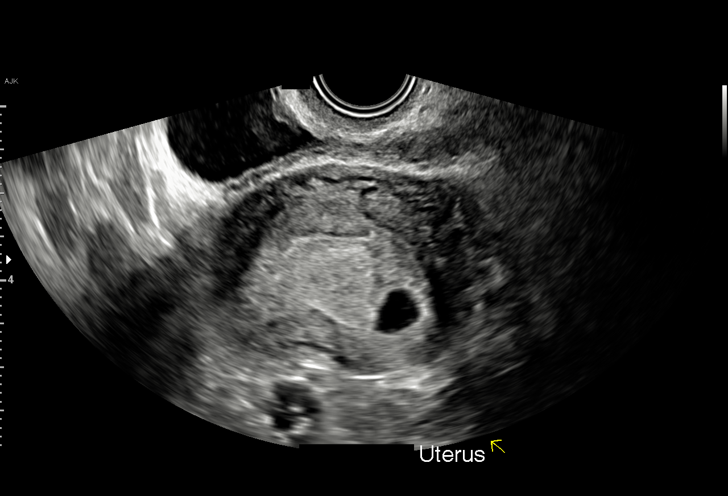
[im 12/37]
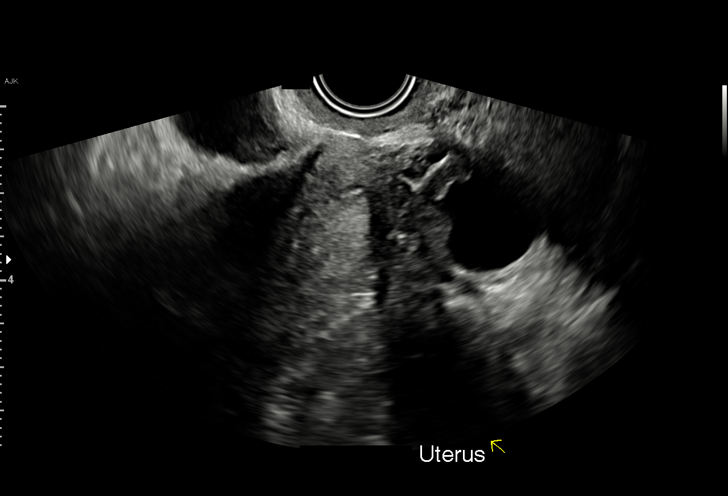
[im 14/37]
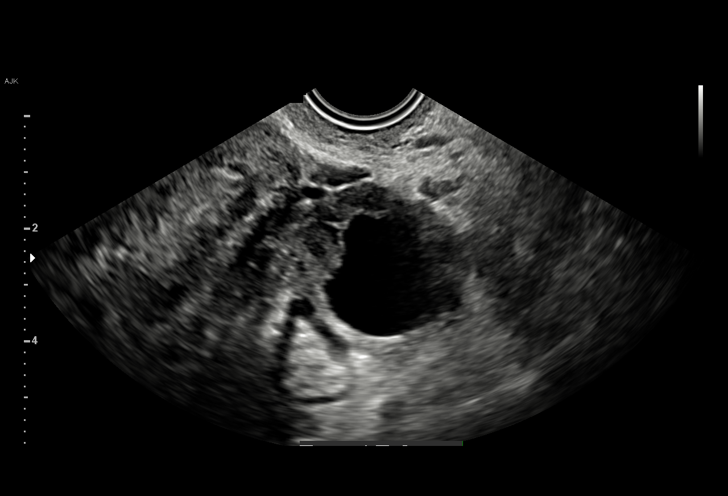
[im 17/37]
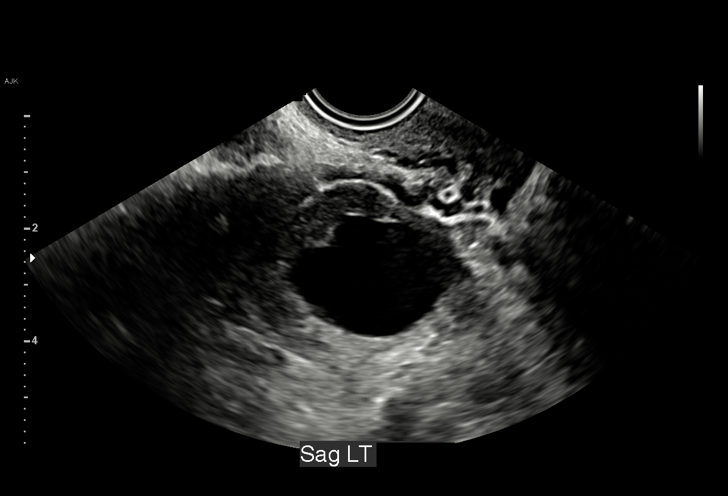
[im 20/37]
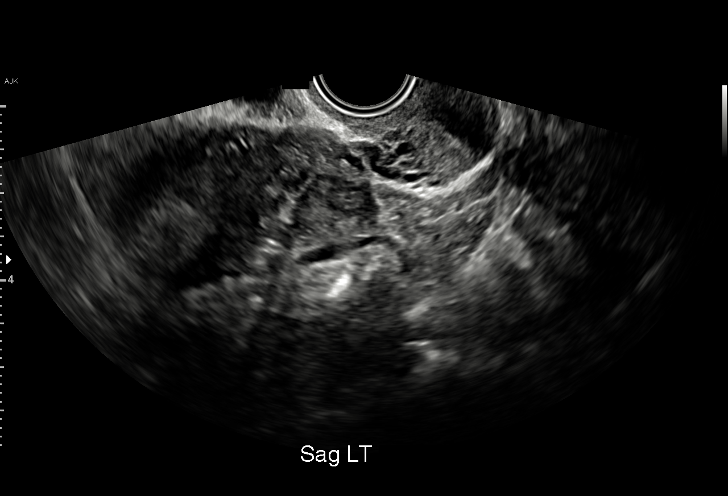
[im 21/37]
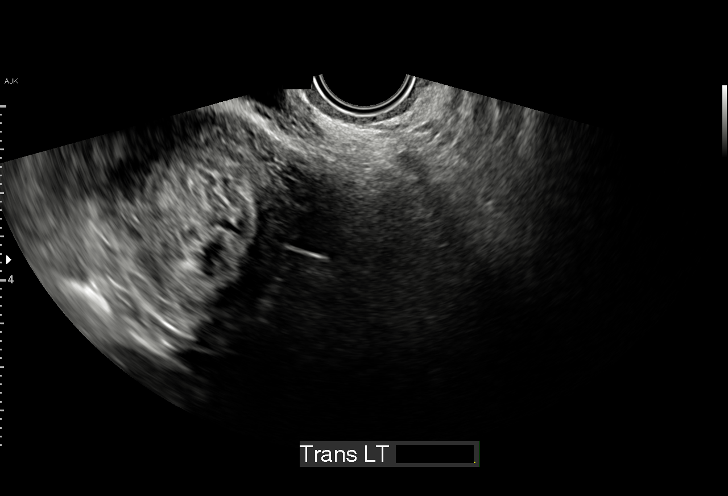
[im 24/37]
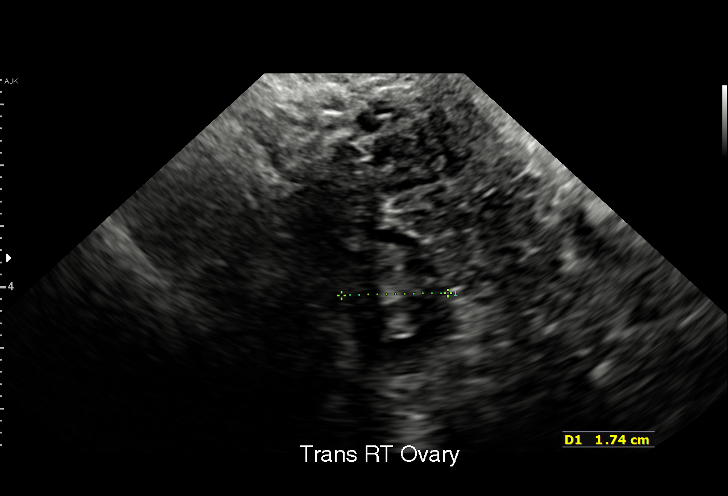
[im 27/37]
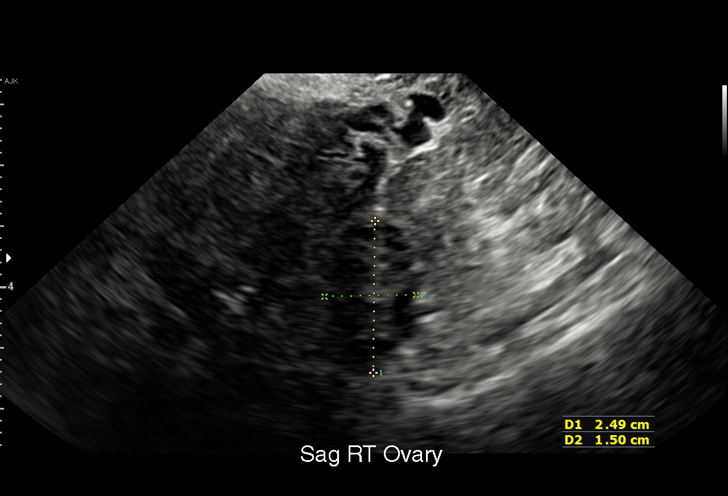
[im 30/37]
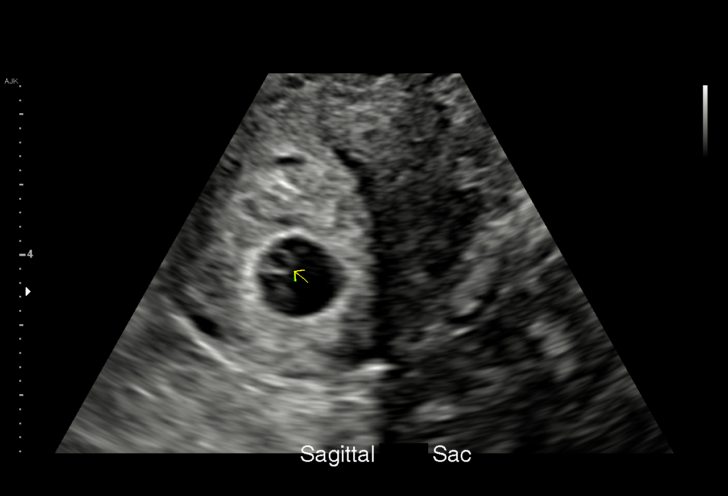
[im 32/37]
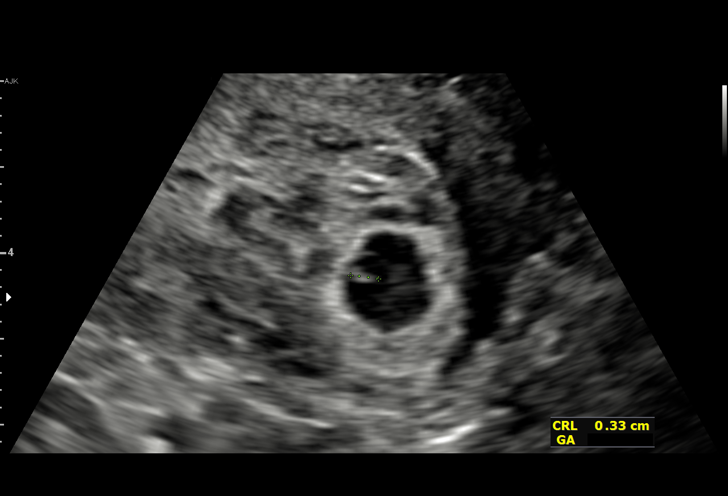
[im 35/37]
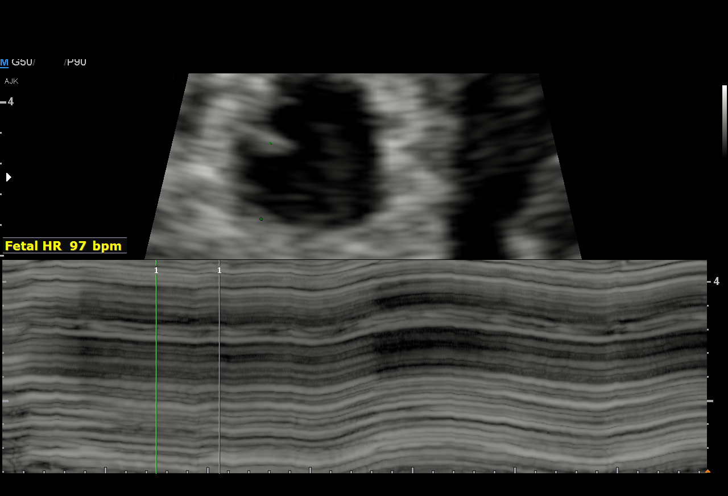

[Series 3: us ob transvaginal · 1 of 2 slices shown (2 of 2)]
[im 1/2]
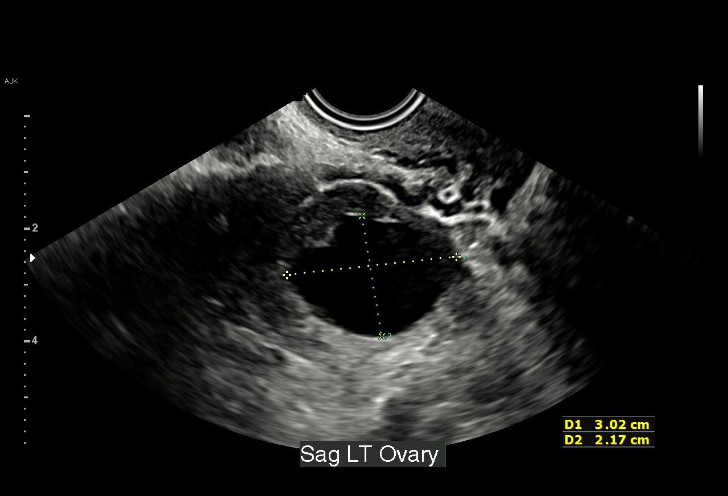

[15 of 28 positions shown; findings below may reference images not displayed]

FINDINGS: Intrauterine gestational sac: Present, single

Yolk sac:  Present

Embryo:  Present

Cardiac Activity: Present

Heart Rate: 97 bpm

CRL: 3.4  mm   5 w 6 d                  US EDC: 07/06/2022

Subchorionic hemorrhage:  None

Maternal uterus/adnexae:

Remainder of maternal uterus normal appearance.

RIGHT ovary unremarkable.

Corpus luteal cyst LEFT ovary.

No free pelvic fluid or adnexal masses.
IMPRESSION: Single live intrauterine gestation at at 5 weeks 6 days EGA by
crown-rump length.

Fetal heart rate 97 bpm, borderline low, recommend attention on
follow-up imaging.

## 2022-08-15 IMAGING — US US OB TRANSVAGINAL
1 series · 15 of 28 positions shown · non-contrast
Comparison: 11/09/2021

CLINICAL DATA: Viability

EXAM:
OBSTETRIC <14 WK ULTRASOUND
TECHNIQUE: Transabdominal ultrasound was performed for evaluation of the
gestation as well as the maternal uterus and adnexal regions.

[Series 1: us ob transvaginal · 15 of 48 slices shown]
[im 1/48]
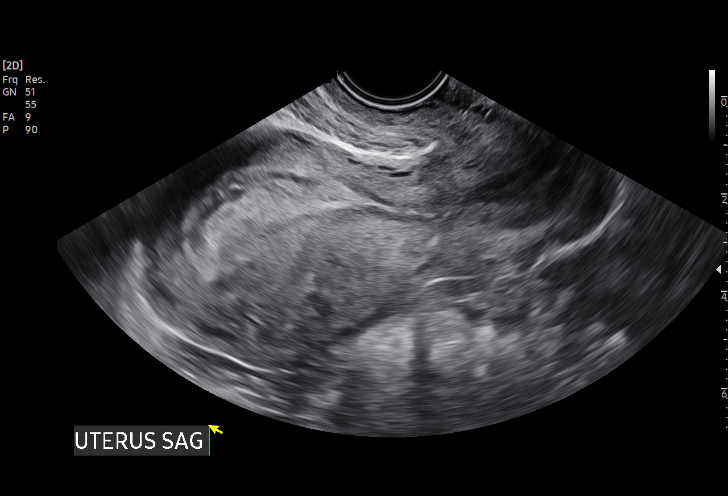
[im 4/48]
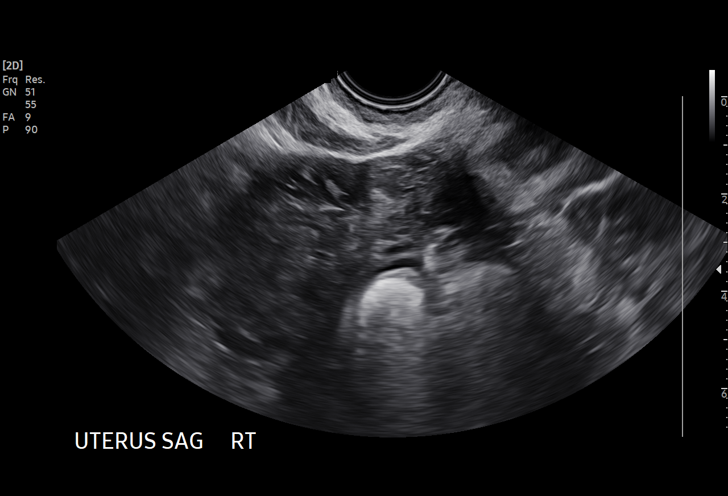
[im 7/48]
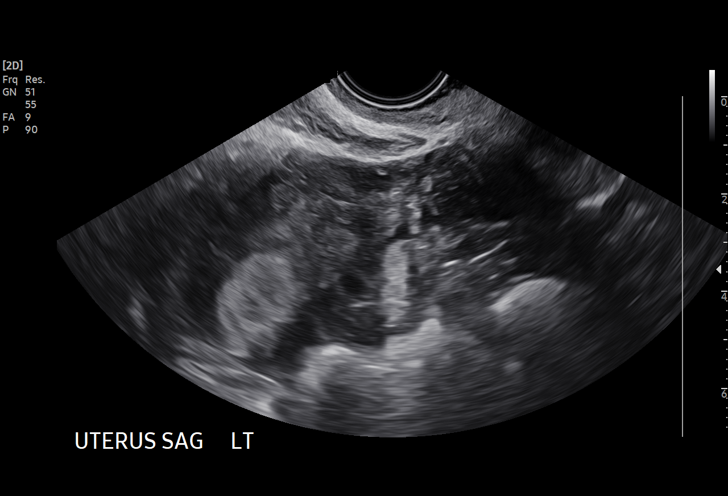
[im 11/48]
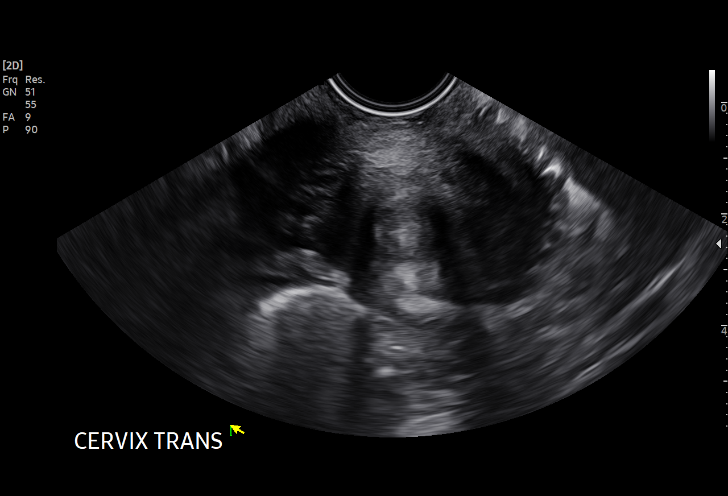
[im 14/48]
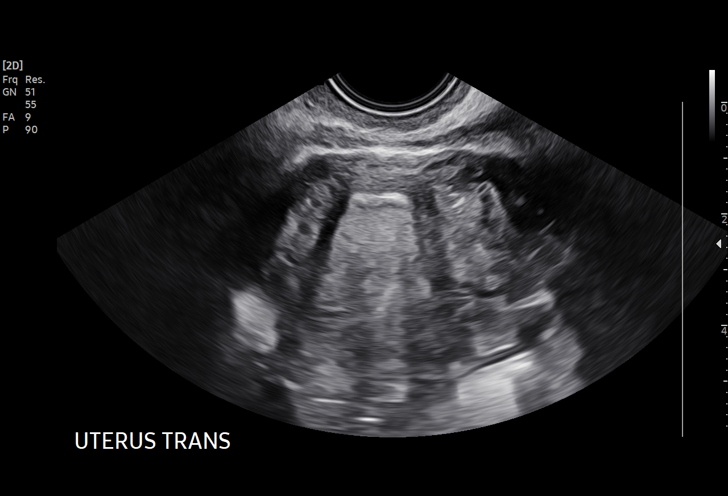
[im 18/48]
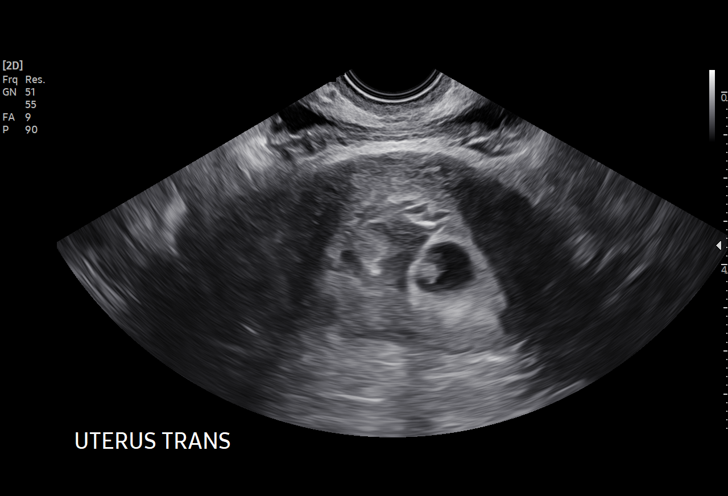
[im 21/48]
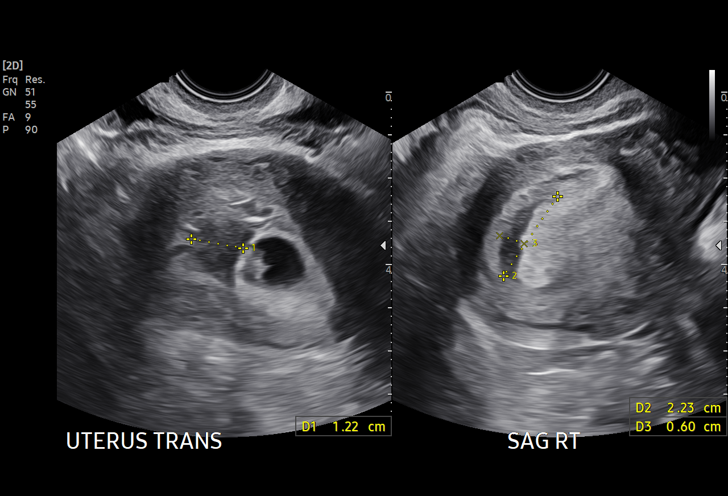
[im 25/48]
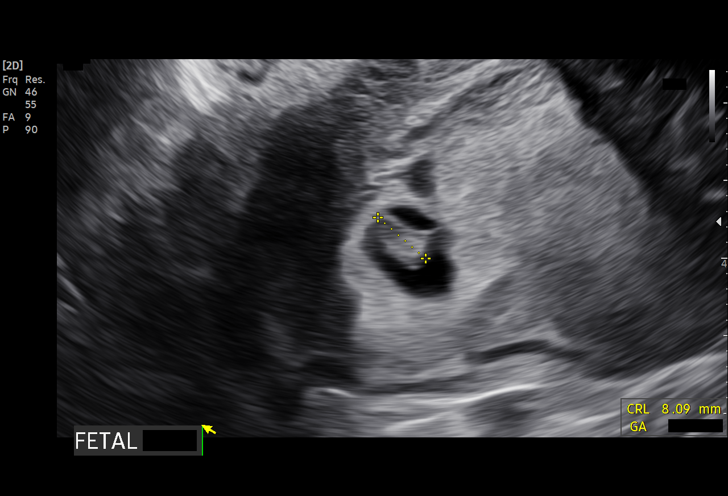
[im 27/48]
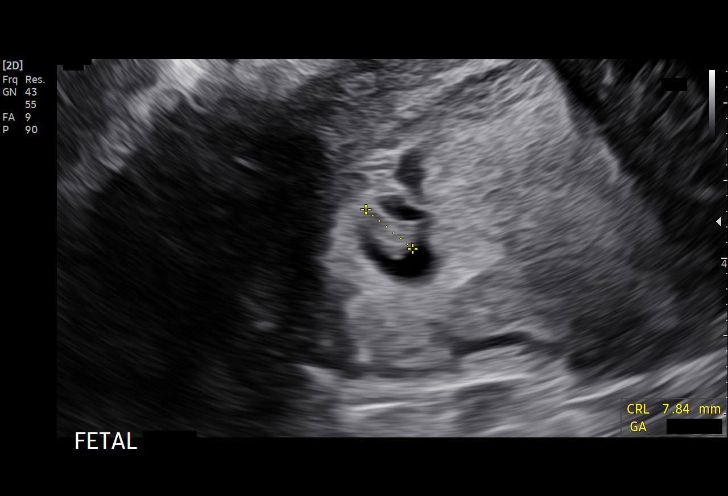
[im 30/48]
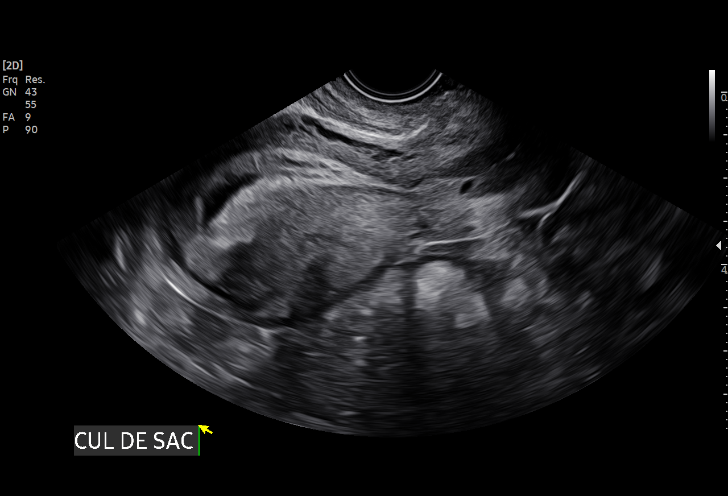
[im 34/48]
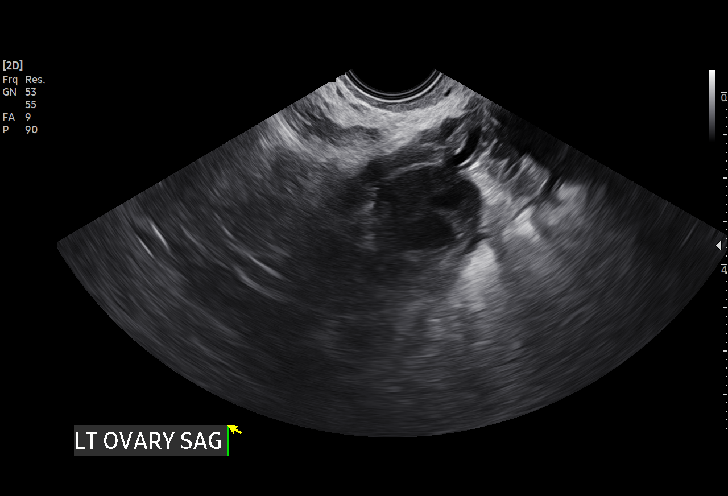
[im 37/48]
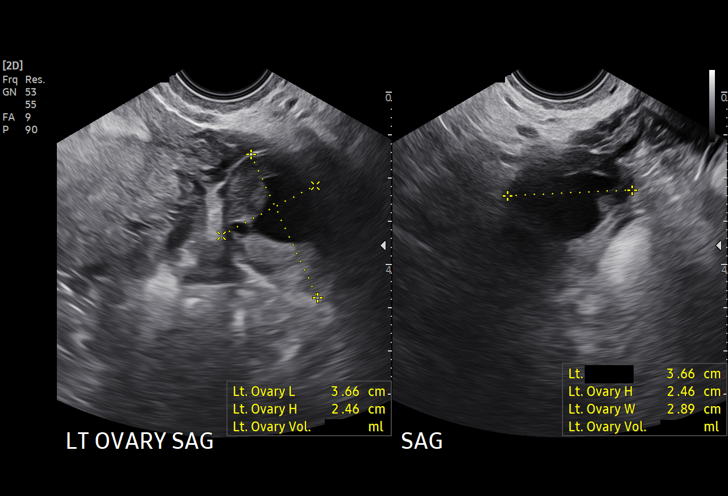
[im 41/48]
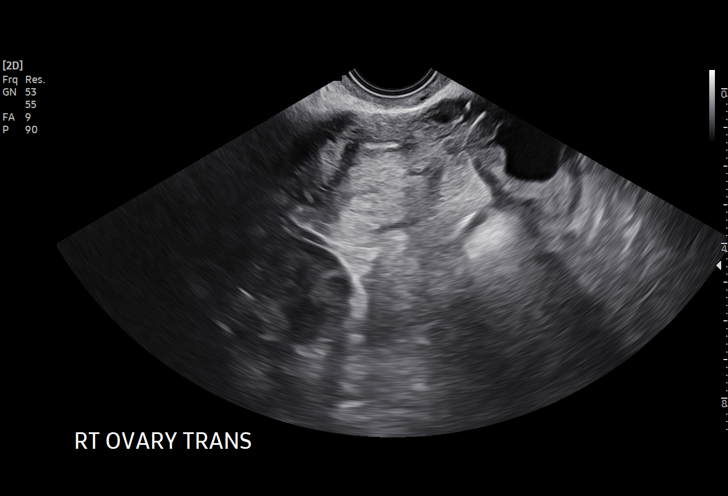
[im 44/48]
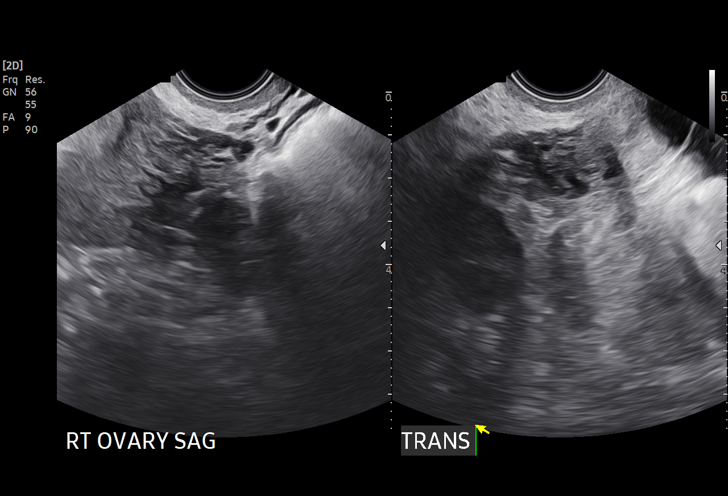
[im 48/48]
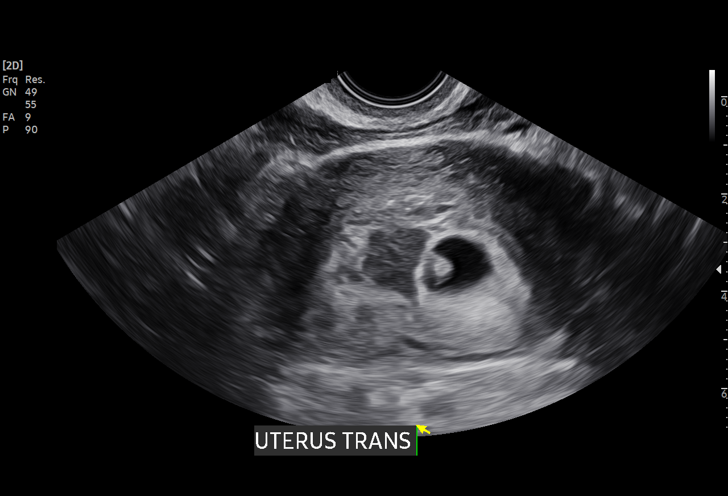

[15 of 28 positions shown; findings below may reference images not displayed]

FINDINGS: Intrauterine gestational sac: Single

Yolk sac:  Visualized.

Embryo:  Visualized.

Cardiac Activity: Visualized.

Heart Rate: 123 bpm

CRL:   8.1 mm   6 w 5 d                  US EDC: 07/07/2022

Subchorionic hemorrhage:  Moderate.

Maternal uterus/adnexae: Unremarkable.
IMPRESSION: 1. Single intrauterine gestation at sonographic gestational age of 6
weeks, 5 days. Fetal heart rate 123 bpm. EDD 07/07/2022.

2.  Moderate subchorionic hemorrhage.

## 2022-08-26 IMAGING — US US OB COMP LESS 14 WK
1 series · 15 of 21 positions shown · non-contrast
Comparison: Obstetrical ultrasound 11/16/2021.

CLINICAL DATA: Spotting and cramping.

EXAM:
OBSTETRIC <14 WK ULTRASOUND
TECHNIQUE: Transabdominal ultrasound was performed for evaluation of the
gestation as well as the maternal uterus and adnexal regions.

[Series 1: us ob comp less 14 wk · 21 acquisitions, 15 frames shown]
[im 1/21]
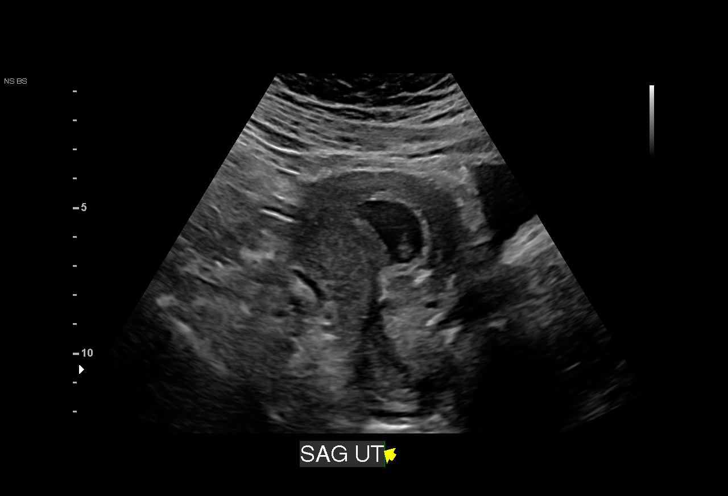
[im 3/21]
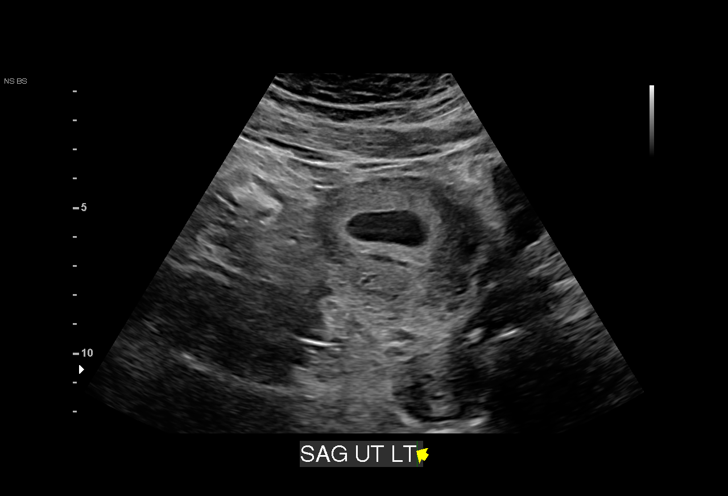
[im 4/21]
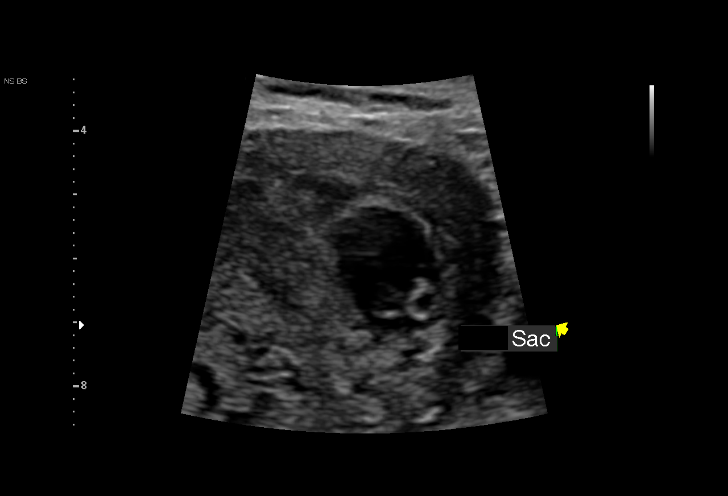
[im 5/21]
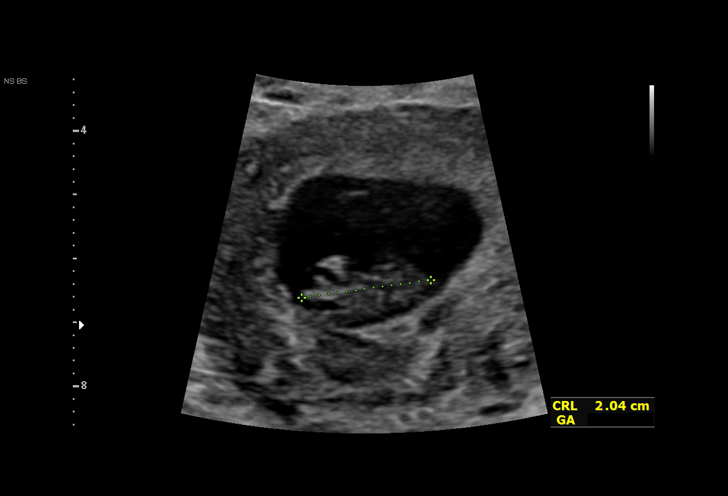
[im 7/21]
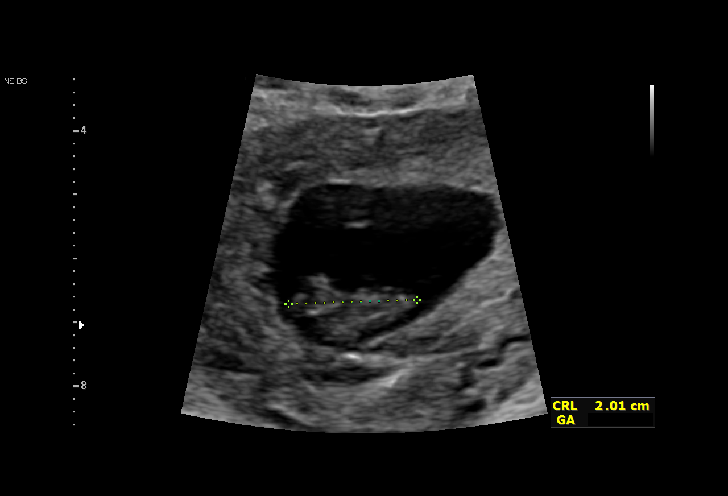
[im 8/21]
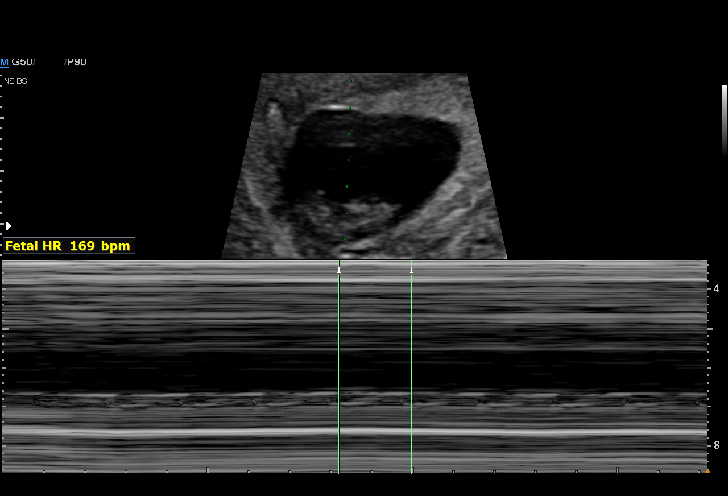
[im 10/21]
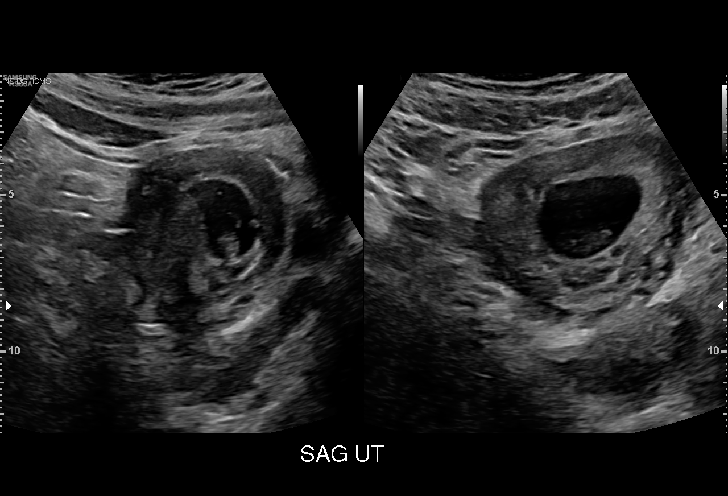
[im 11/21]
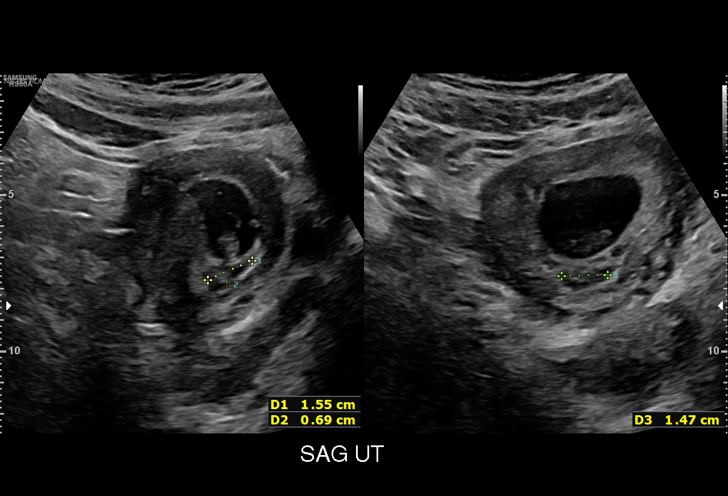
[im 12/21]
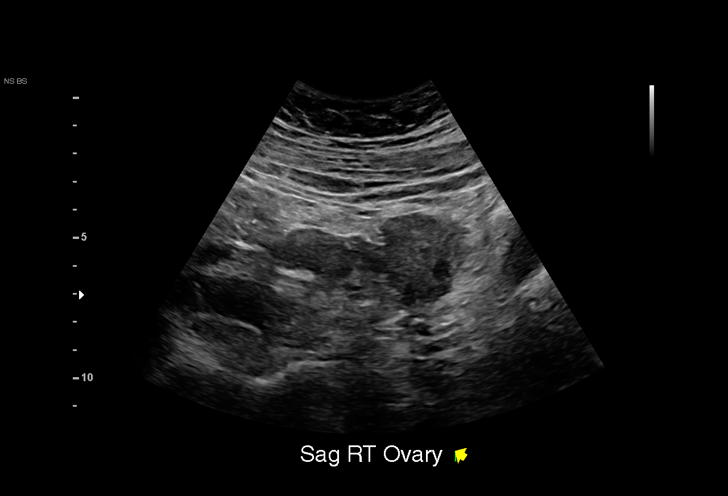
[im 14/21]
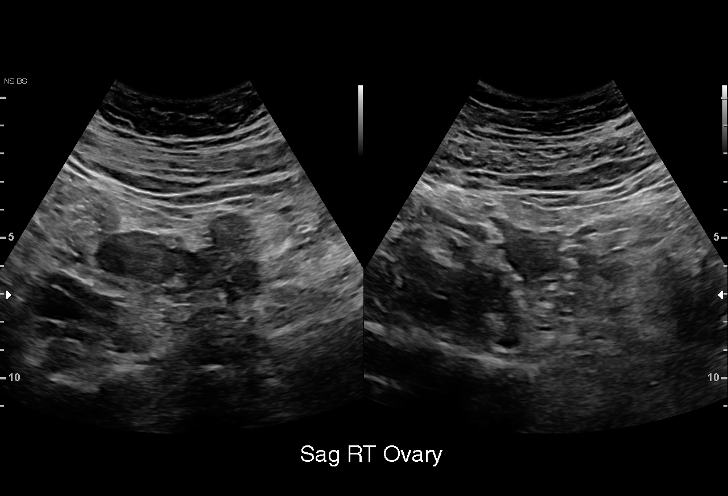
[im 15/21]
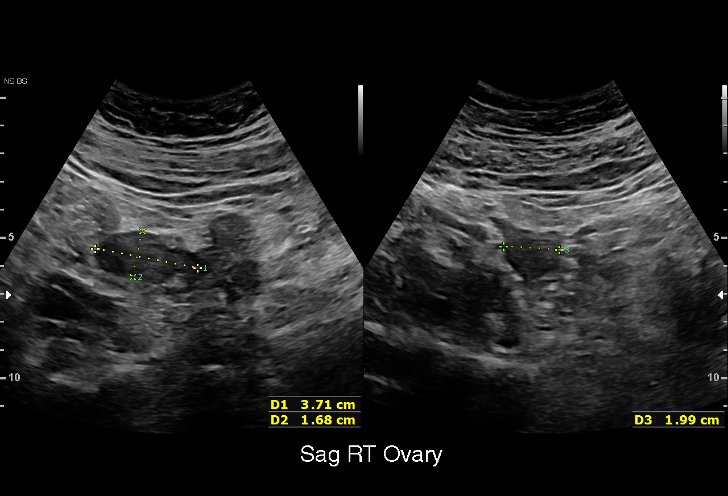
[im 17/21]
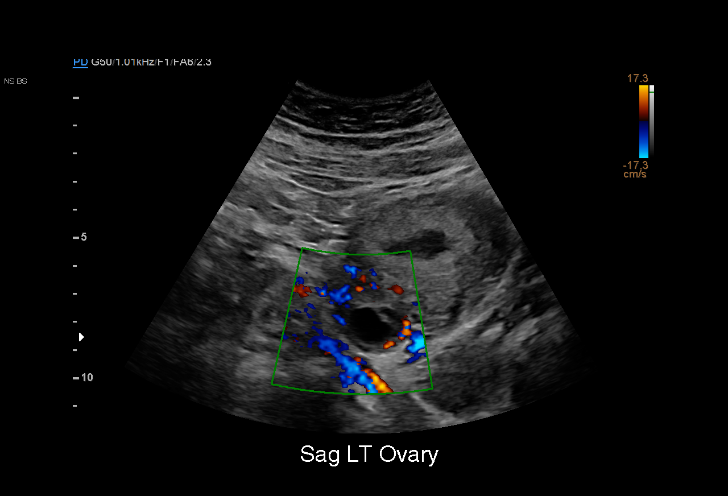
[im 18/21]
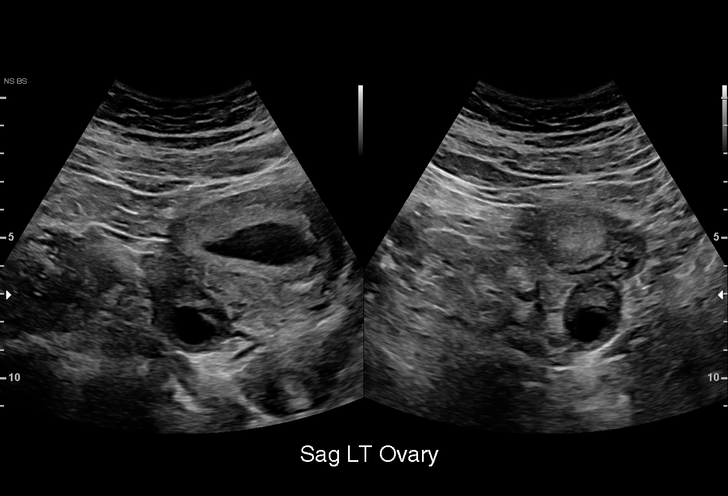
[im 19/21]
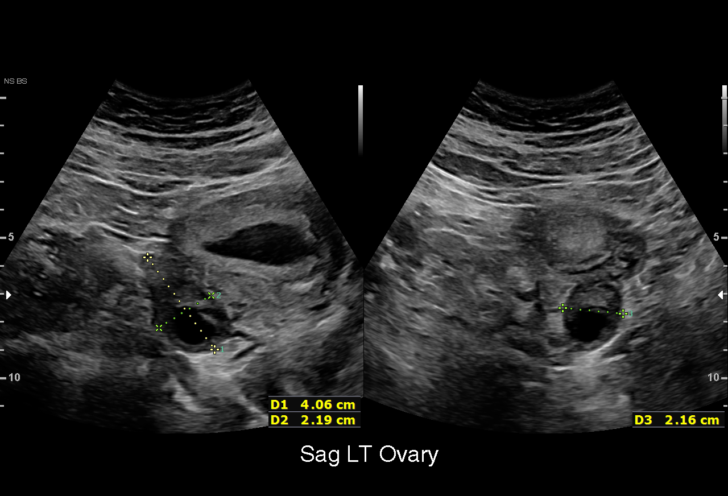
[im 21/21]
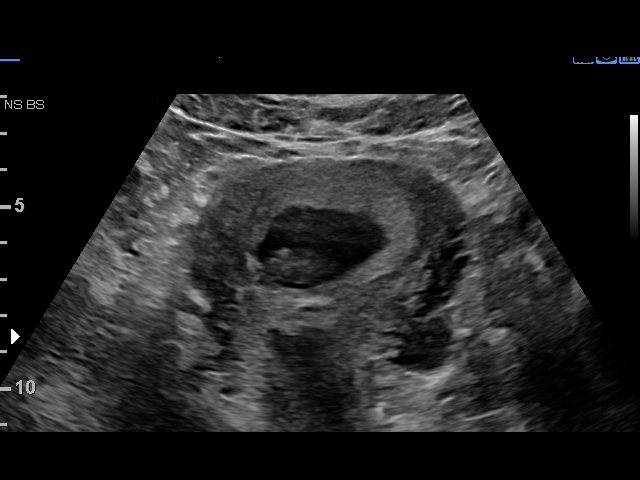

[15 of 21 positions shown; findings below may reference images not displayed]

FINDINGS: Intrauterine gestational sac: Single

Yolk sac:  Visualized.

Embryo:  Visualized.

Cardiac Activity: Visualized.

Heart Rate: 169 bpm

CRL:   20.2 mm mm   8 w 4 d                  US EDC: 07/05/2022

Subchorionic hemorrhage: Small subchorionic hemorrhage seen
posteriorly measuring 1.5 x 0.7 x 1.5 cm.

Maternal uterus/adnexae: Maternal ovaries are within normal limits.
IMPRESSION: 1. Single live intrauterine gestation measuring 8 weeks and 4 days.
2. Small subchorionic hemorrhage.

## 2022-12-10 ENCOUNTER — Ambulatory Visit
Admission: EM | Admit: 2022-12-10 | Discharge: 2022-12-10 | Disposition: A | Discharge status: Home / Self Care | Payer: Medicaid Other | Attending: Internal Medicine | Admitting: Internal Medicine

## 2022-12-10 ENCOUNTER — Encounter: Payer: Self-pay | Admitting: Emergency Medicine

## 2022-12-10 ENCOUNTER — Other Ambulatory Visit: Payer: Self-pay

## 2022-12-10 DIAGNOSIS — J069 Acute upper respiratory infection, unspecified: Secondary | ICD-10-CM | POA: Diagnosis not present

## 2022-12-10 DIAGNOSIS — J029 Acute pharyngitis, unspecified: Secondary | ICD-10-CM | POA: Insufficient documentation

## 2022-12-10 LAB — POCT RAPID STREP A (OFFICE): Rapid Strep A Screen: NEGATIVE

## 2022-12-10 MED ORDER — FLUTICASONE PROPIONATE 50 MCG/ACT NA SUSP: 1.0000 | Freq: Every day | NASAL | 0 refills | Status: DC

## 2022-12-10 MED ORDER — CHLORASEPTIC 1.4 % MT LIQD: 1.0000 | OROMUCOSAL | 0 refills | Status: DC | PRN

## 2022-12-10 MED ORDER — GUAIFENESIN 200 MG PO TABS: 200.0000 mg | ORAL_TABLET | ORAL | 0 refills | Status: DC | PRN

## 2022-12-10 NOTE — Discharge Instructions (Signed)
Rapid strep was negative.  Throat culture pending.  We will call if it is abnormal.  I have prescribed a few different medications which should be safe with breast-feeding to help alleviate symptoms.  Please follow-up if symptoms persist or worsen.

## 2022-12-10 NOTE — ED Triage Notes (Signed)
Pt sts sore throat, nasal congestion and ear pain x 1 week

## 2022-12-10 NOTE — ED Provider Notes (Addendum)
[facility]    CSN: [id number] Arrival date & time: 12/10/22  1205      History   Chief Complaint Chief Complaint  Patient presents with   Sore Throat    HPI Vicki Daniel is a 26 y.o. female.   Patient presents with sore throat, nasal congestion, ear pain that has been present for about 6 days.  Denies cough, chest pain, shortness of breath, nausea, vomiting, diarrhea, abdominal pain.  Patient reports history of asthma but has not been having to use her albuterol inhaler during this acute illness.  Denies any known fevers or sick contacts.  Patient not report medications for symptoms.   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 Authorizing Provider  fluticasone (FLONASE) 50 MCG/ACT nasal spray Place 1 spray into both nostrils daily. 12/10/22  Yes Cayman Brogden, Rolly Salter E, FNP  guaiFENesin 200 MG tablet Take 1 tablet (200 mg total) by mouth every 4 (four) hours as needed for cough or to loosen phlegm. 12/10/22  Yes Tiarna Koppen, Rolly Salter E, FNP  phenol (CHLORASEPTIC) 1.4 % LIQD Use as directed 1 spray in the mouth or throat as needed for throat irritation / pain. 12/10/22  Yes Bryn Perkin, Rolly Salter E,  FNP  albuterol (VENTOLIN HFA) 108 (90 Base) MCG/ACT inhaler Use 2 puffs every 4-6 hours as needed for wheezing. 04/10/22   Wallis Bamberg, PA-C  ibuprofen (ADVIL) 600 MG tablet Take 1 tablet (600 mg total) by mouth every 6 (six) hours as needed. 07/14/22   Waynard Reeds, MD  Prenatal Vit-Fe Fumarate-FA (PREPLUS) 27-1 MG TABS Take by mouth.    [provider]  promethazine (PHENERGAN) 25 MG suppository Place 1 suppository (25 mg total) rectally every 6 (six) hours as needed for nausea or vomiting. Patient not taking: Reported on 07/18/2020 11/23/17 07/18/20  Melene Plan, DO    Family History Family History  Problem Relation Age of Onset   Hypertension Father     Social History Social History   Tobacco Use   Smoking status: Never   Smokeless tobacco: Never  Vaping Use   Vaping Use: Former  Substance Use Topics   Alcohol use: Not Currently   Drug use: Not Currently    Types: Marijuana    Comment: stopped a week before found out pregnant     Allergies   Patient has no known allergies.   Review of Systems Review of Systems Per  HPI  Physical Exam Triage Vital Signs ED Triage Vitals  Enc Vitals Group     BP 12/10/22 1530 114/73     Pulse Rate 12/10/22 1530 73     Resp 12/10/22 1530 18     Temp 12/10/22 1530 98.2 F (36.8 C)     Temp Source 12/10/22 1530 Oral     SpO2 12/10/22 1530 97 %     Weight --      Height --      Head Circumference --      Peak Flow --      Pain Score 12/10/22 1531 6     Pain Loc --      Pain Edu? --      Excl. in GC? --    No data found.  Updated Vital Signs BP 114/73 (BP Location: Left Arm)   Pulse 73   Temp 98.2 F (36.8 C) (Oral)   Resp 18   SpO2 97%   Visual Acuity Right Eye Distance:   Left Eye Distance:   Bilateral Distance:    Right Eye Near:   Left Eye Near:    Bilateral Near:     Physical Exam Constitutional:      General: She is not in acute distress.    Appearance: Normal appearance. She is not toxic-appearing or  diaphoretic.  HENT:     Head: Normocephalic and atraumatic.     Right Ear: Tympanic membrane and ear canal normal.     Left Ear: Tympanic membrane and ear canal normal.     Nose: Congestion present.     Mouth/Throat:     Mouth: Mucous membranes are moist.     Pharynx: Posterior oropharyngeal erythema present.  Eyes:     Extraocular Movements: Extraocular movements intact.     Conjunctiva/sclera: Conjunctivae normal.     Pupils: Pupils are equal, round, and reactive to light.  Cardiovascular:     Rate and Rhythm: Normal rate and regular rhythm.     Pulses: Normal pulses.     Heart sounds: Normal heart sounds.  Pulmonary:     Effort: Pulmonary effort is normal. No respiratory distress.     Breath sounds: Normal breath sounds. No stridor. No wheezing, rhonchi or rales.  Abdominal:     General: Abdomen is flat. Bowel sounds are normal.     Palpations: Abdomen is soft.  Musculoskeletal:        General: Normal range of motion.     Cervical back: Normal range of motion.  Skin:    General: Skin is warm and dry.  Neurological:     General: No focal deficit present.     Mental Status: She is alert and oriented to person, place, and time. Mental status is at baseline.  Psychiatric:        Mood and Affect: Mood normal.        Behavior: Behavior normal.      UC Treatments / Results  Labs (all labs ordered are listed, but only abnormal results are displayed) Labs Reviewed  CULTURE, GROUP A STREP [facility])  POCT RAPID STREP A (OFFICE)    EKG   Radiology No results found.  Procedures Procedures (including critical care time)  Medications Ordered in UC Medications - No data to display  Initial Impression / Assessment and Plan / UC Course  I have reviewed the triage vital signs and the nursing notes.  Pertinent labs & imaging results that were available during my care of the patient  were reviewed by me and considered in my medical decision making (see chart for details).      Patient presents with symptoms likely from a viral upper respiratory infection. Differential includes bacterial pneumonia, sinusitis, allergic rhinitis, COVID-19, flu, RSV. Do not suspect underlying cardiopulmonary process. Symptoms seem unlikely related to ACS, CHF or COPD exacerbations, pneumonia, pneumothorax. Patient is nontoxic appearing and not in need of emergent medical intervention.  Rapid strep is negative.  Throat culture pending.  Patient declined COVID testing.  Recommended symptom control with medications and supportive care that are safe with breast-feeding.  Patient sent prescriptions.  Return if symptoms fail to improve in 1-2 weeks or you develop shortness of breath, chest pain, severe headache. Patient states understanding and is agreeable.  Discharged with PCP followup.  Final Clinical Impressions(s) / UC Diagnoses   Final diagnoses:  Viral upper respiratory tract infection with cough  Sore throat     Discharge Instructions      Rapid strep was negative.  Throat culture pending.  We will call if it is abnormal.  I have prescribed a few different medications which should be safe with breast-feeding to help alleviate symptoms.  Please follow-up if symptoms persist or worsen.    ED Prescriptions     Medication Sig Dispense Auth. Provider   fluticasone (FLONASE) 50 MCG/ACT nasal spray Place 1 spray into both nostrils daily. 16 g Rasmus Preusser, Rolly Salter E, [place]   phenol (CHLORASEPTIC) 1.4 % LIQD Use as directed 1 spray in the mouth or throat as needed for throat irritation / pain. 118 mL Ervin Knack E, [place]   guaiFENesin 200 MG tablet Take 1 tablet (200 mg total) by mouth every 4 (four) hours as needed for cough or to loosen phlegm. 30 tablet [place], Acie Fredrickson, [place]      PDMP not reviewed this encounter.   Gustavus Bryant, [place] 12/10/22 1716    Gustavus Bryant, [place] 12/10/22 [phone]

## 2022-12-14 LAB — CULTURE, GROUP A STREP (THRC)

## 2023-01-04 DIAGNOSIS — N760 Acute vaginitis: Secondary | ICD-10-CM | POA: Diagnosis not present

## 2023-01-04 DIAGNOSIS — N898 Other specified noninflammatory disorders of vagina: Secondary | ICD-10-CM | POA: Diagnosis not present

## 2023-01-04 DIAGNOSIS — Z113 Encounter for screening for infections with a predominantly sexual mode of transmission: Secondary | ICD-10-CM | POA: Diagnosis not present

## 2023-07-12 DIAGNOSIS — N898 Other specified noninflammatory disorders of vagina: Secondary | ICD-10-CM | POA: Diagnosis not present

## 2023-07-12 DIAGNOSIS — Z113 Encounter for screening for infections with a predominantly sexual mode of transmission: Secondary | ICD-10-CM | POA: Diagnosis not present

## 2023-08-16 DIAGNOSIS — N898 Other specified noninflammatory disorders of vagina: Secondary | ICD-10-CM | POA: Diagnosis not present

## 2023-08-29 DIAGNOSIS — Z01411 Encounter for gynecological examination (general) (routine) with abnormal findings: Secondary | ICD-10-CM | POA: Diagnosis not present

## 2023-08-29 DIAGNOSIS — N898 Other specified noninflammatory disorders of vagina: Secondary | ICD-10-CM | POA: Diagnosis not present

## 2023-08-29 DIAGNOSIS — Z01419 Encounter for gynecological examination (general) (routine) without abnormal findings: Secondary | ICD-10-CM | POA: Diagnosis not present

## 2023-08-29 DIAGNOSIS — Z1389 Encounter for screening for other disorder: Secondary | ICD-10-CM | POA: Diagnosis not present

## 2023-08-29 DIAGNOSIS — Z130 Encounter for screening for diseases of the blood and blood-forming organs and certain disorders involving the immune mechanism: Secondary | ICD-10-CM | POA: Diagnosis not present

## 2023-08-29 DIAGNOSIS — Z124 Encounter for screening for malignant neoplasm of cervix: Secondary | ICD-10-CM | POA: Diagnosis not present

## 2023-09-01 DIAGNOSIS — J069 Acute upper respiratory infection, unspecified: Secondary | ICD-10-CM | POA: Diagnosis not present

## 2024-01-19 ENCOUNTER — Telehealth: Payer: Self-pay | Admitting: Nurse Practitioner

## 2024-01-19 NOTE — Telephone Encounter (Signed)
 Copied from CRM [id number]. Topic: Referral - Request for Referral >> Jan 18, 2024 12:06 PM Deleta HERO wrote: Did the patient discuss referral with their provider in the last year? No (If No - schedule appointment) (If Yes - send message)  Appointment offered? Yes  Type of order/referral and detailed reason for visit: [facility]  Preference of office, provider, location: [facility] specifically at [address] [place], [place], [place] [address] [phone]  If referral order, have you been seen by this specialty before? Yes (If Yes, this issue or another issue? When? Where?  Can we respond through MyChart? No, callback preferred.

## 2024-01-19 NOTE — Telephone Encounter (Addendum)
 Pt called to follow up on referral request.  I explained she had not been seen here since 12/01/2021 (video visit)  she said Bascom borer is on her card, and she wants to speak directly w/ Bascom, not a nurse.  Because Tonya is listed on her medicaid card.  I tried to explain, but pt cut me off every time and I did not want to argue w/ her.  So I told her I would be happy to send the message.  I did mention I see her pcp is Alfonso Goltz.  She said she is not calling her b/c that is not who is on her card.  Pt also said whoever took the first message told her ok for referral.

## 2024-02-12 DIAGNOSIS — L700 Acne vulgaris: Secondary | ICD-10-CM | POA: Diagnosis not present

## 2024-04-08 DIAGNOSIS — Z113 Encounter for screening for infections with a predominantly sexual mode of transmission: Secondary | ICD-10-CM | POA: Diagnosis not present

## 2024-04-08 DIAGNOSIS — B9689 Other specified bacterial agents as the cause of diseases classified elsewhere: Secondary | ICD-10-CM | POA: Diagnosis not present

## 2024-04-08 DIAGNOSIS — N898 Other specified noninflammatory disorders of vagina: Secondary | ICD-10-CM | POA: Diagnosis not present

## 2024-04-08 DIAGNOSIS — N760 Acute vaginitis: Secondary | ICD-10-CM | POA: Diagnosis not present

## 2024-04-30 ENCOUNTER — Ambulatory Visit
Admission: EM | Admit: 2024-04-30 | Discharge: 2024-04-30 | Disposition: A | Discharge status: Home / Self Care | Attending: Family Medicine | Admitting: Family Medicine

## 2024-04-30 DIAGNOSIS — N926 Irregular menstruation, unspecified: Secondary | ICD-10-CM | POA: Insufficient documentation

## 2024-04-30 DIAGNOSIS — J029 Acute pharyngitis, unspecified: Secondary | ICD-10-CM | POA: Insufficient documentation

## 2024-04-30 DIAGNOSIS — Z202 Contact with and (suspected) exposure to infections with a predominantly sexual mode of transmission: Secondary | ICD-10-CM | POA: Insufficient documentation

## 2024-04-30 DIAGNOSIS — R110 Nausea: Secondary | ICD-10-CM | POA: Insufficient documentation

## 2024-04-30 LAB — POCT URINE PREGNANCY: Preg Test, Ur: NEGATIVE

## 2024-04-30 LAB — POCT RAPID STREP A (OFFICE): Rapid Strep A Screen: NEGATIVE

## 2024-04-30 MED ORDER — ONDANSETRON 4 MG PO TBDP: 4.0000 mg | ORAL_TABLET | Freq: Three times a day (TID) | ORAL | 0 refills | Status: DC | PRN

## 2024-04-30 NOTE — Discharge Instructions (Addendum)
 The pregnancy test is negative  Your strep test is negative.  Culture of the throat will be sent, and staff will notify you if that is in turn positive.  Staff will notify you if there is anything positive on the swabs(of your throat or vagina) or on the bloodwork.  Take Tylenol  as needed for the sore throat, and Cepacol spray also can help.

## 2024-04-30 NOTE — ED Provider Notes (Signed)
 [facility]    CSN: [id number] Arrival date & time: 04/30/24  1132      History   Chief Complaint Chief Complaint  Patient presents with   Sore Throat   SEXUALLY TRANSMITTED DISEASE   Possible Pregnancy    HPI Vicki Daniel is a 28 y.o. female.    Sore Throat  Possible Pregnancy  Here for sore throat that has been going on for about 2 weeks.  No cough or congestion.  She has had some pain around her left neck under the chin also.  Initially she had about 2 days of subjective fever and malaise, but none since.  Last menstrual cycle was April 20.  She has done a UPT at home that was negative.  She is concerned about STDs in her throat also.  She has had some intermittent left lower quadrant cramping, last time was yesterday.  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   Substance abuse (HCC) 11/26/2017   Acute lower UTI 11/25/2017   Hypokalemia 11/25/2017   Trichomonas vaginitis 11/25/2017   Bacterial vaginosis 11/25/2017   Intractable nausea and vomiting 11/25/2017   Hematemesis 11/25/2017   Cannabinoid hyperemesis syndrome    Allergic rhinitis 09/19/2016   Tonsillolith 09/19/2016   Acne vulgaris 03/02/2015    Past Surgical History:  Procedure Laterality Date   WISDOM TOOTH EXTRACTION      OB History     Gravida  4   Para  1   Term  1   Preterm  0   AB  3   Living  1      SAB  2   IAB  1   Ectopic  0   Multiple  0   Live Births  1            Home Medications    Prior to Admission medications   Medication Sig Start Date End Date Taking? Authorizing Provider  diphenhydrAMINE  (BENADRYL  ALLERGY) 25 mg capsule Take 25 mg by mouth every 6 (six) hours as needed. 03/02/15  Yes [provider]   ethynodiol-ethinyl estradiol (KELNOR 1/35) 1-35 MG-MCG tablet Take 1 tablet by mouth daily. 01/25/17  Yes [provider]  fexofenadine (ALLEGRA) 60 MG tablet Take 60 mg by mouth 2 (two) times daily. 03/02/15  Yes [provider]  metroNIDAZOLE  (METROGEL ) 0.75 % vaginal gel Place 1 Applicatorful vaginally as directed. 04/08/24  Yes [provider]  ondansetron  (ZOFRAN -ODT) 4 MG disintegrating tablet Take 1 tablet (4 mg total) by mouth every 8 (eight) hours as needed for nausea or vomiting. 04/30/24  Yes Angellynn Kimberlin K, MD  spironolactone (ALDACTONE) 50 MG tablet Take 50 mg by mouth daily. 02/12/24  Yes [provider]  tretinoin (RETIN-A) 0.025 % cream Apply 1 Application topically at bedtime. 03/09/24  Yes [provider]  tretinoin (RETIN-A) 0.05 % cream Apply 1 Application topically at bedtime. 03/02/15  Yes [provider]  TRI-ESTARYLLA 0.18/0.215/0.25 MG-35 MCG tablet Take 1 tablet by mouth daily. 02/12/24  Yes [provider]  albuterol  (VENTOLIN  HFA) 108 (90 Base) MCG/ACT inhaler Use 2 puffs every 4-6 hours as needed for wheezing. 04/10/22   Adolph Hoop, PA-C  Azelastine -Fluticasone  137-50 MCG/ACT SUSP Place 1 spray into both nostrils 2 (  two) times daily.    [provider]  fluticasone  (FLONASE ) 50 MCG/ACT nasal spray Place 1 spray into both nostrils daily. 12/10/22   Dodson Freestone, FNP  guaiFENesin  200 MG tablet Take 1 tablet (200 mg total) by mouth every 4 (four) hours as needed for cough or to loosen phlegm. 12/10/22   Mound, Haley E, FNP  phenol (CHLORASEPTIC) 1.4 % LIQD Use as directed 1 spray in the mouth or throat as needed for throat irritation / pain. 12/10/22   Dodson Freestone, FNP  Prenatal Vit-Fe Fumarate-FA (PREPLUS) 27-1 MG TABS Take by mouth.    [provider]  promethazine  (PHENERGAN ) 25 MG suppository Place 1 suppository (25 mg total) rectally every 6 (six) hours as needed for nausea or  vomiting. Patient not taking: Reported on 07/18/2020 11/23/17 07/18/20  Albertus Hughs, DO    Family History Family History  Problem Relation Age of Onset   Hypertension Father     Social History Social History   Tobacco Use   Smoking status: Never   Smokeless tobacco: Never  Vaping Use   Vaping status: Former   Substances: Nicotine, Flavoring  Substance Use Topics   Alcohol use: Not Currently   Drug use: Not Currently    Types: Marijuana    Comment: stopped a week before found out pregnant     Allergies   Patient has no known allergies.   Review of Systems Review of Systems   Physical Exam Triage Vital Signs ED Triage Vitals  Encounter Vitals Group     BP 04/30/24 1145 109/70     Systolic BP Percentile --      Diastolic BP Percentile --      Pulse Rate 04/30/24 1145 (!) 103     Resp 04/30/24 1145 18     Temp 04/30/24 1145 98.3 F (36.8 C)     Temp Source 04/30/24 1145 Oral     SpO2 04/30/24 1145 98 %     Weight 04/30/24 1142 230 lb (104.3 kg)     Height 04/30/24 1142 5\' 9"  (1.753 m)     Head Circumference --      Peak Flow --      Pain Score 04/30/24 1142 6     Pain Loc --      Pain Education --      Exclude from Growth Chart --    No data found.  Updated Vital Signs BP 109/70 (BP Location: Left Arm)   Pulse (!) 103   Temp 98.3 F (36.8 C) (Oral)   Resp 18   Ht 5\' 9"  (1.753 m)   Wt 104.3 kg   LMP 03/31/2024 (Exact Date)   SpO2 98%   BMI 33.97 kg/m   Visual Acuity Right Eye Distance:   Left Eye Distance:   Bilateral Distance:    Right Eye Near:   Left Eye Near:    Bilateral Near:     Physical Exam Vitals reviewed.  Constitutional:      General: She is not in acute distress.    Appearance: She is not toxic-appearing.  HENT:     Right Ear: Tympanic membrane and ear canal normal.     Left Ear: Tympanic membrane and ear canal normal.     Nose: Nose normal.     Mouth/Throat:     Mouth: Mucous membranes are moist.     Comments: There is  some mild erythema of the tonsillar pillars.  No tonsillar hypertrophy. Eyes:  Extraocular Movements: Extraocular movements intact.     Conjunctiva/sclera: Conjunctivae normal.     Pupils: Pupils are equal, round, and reactive to light.  Neck:     Comments: There is a tender 1 cm lymph node in the left submandibular area. Cardiovascular:     Rate and Rhythm: Normal rate and regular rhythm.     Heart sounds: No murmur heard. Pulmonary:     Effort: Pulmonary effort is normal. No respiratory distress.     Breath sounds: No stridor. No wheezing, rhonchi or rales.  Abdominal:     Palpations: Abdomen is soft.     Tenderness: There is no abdominal tenderness.  Musculoskeletal:     Cervical back: Neck supple.  Skin:    Capillary Refill: Capillary refill takes less than 2 seconds.     Coloration: Skin is not jaundiced or pale.  Neurological:     General: No focal deficit present.     Mental Status: She is alert and oriented to person, place, and time.  Psychiatric:        Behavior: Behavior normal.      UC Treatments / Results  Labs (all labs ordered are listed, but only abnormal results are displayed) Labs Reviewed  POCT RAPID STREP A (OFFICE) - Normal  POCT URINE PREGNANCY - Normal  CULTURE, GROUP A STREP ([facility])  HIV ANTIBODY (ROUTINE TESTING W REFLEX)  RPR  BETA HCG QUANT (REF LAB)  CERVICOVAGINAL ANCILLARY ONLY  CYTOLOGY, (ORAL, ANAL, URETHRAL) ANCILLARY ONLY    EKG   Radiology No results found.  Procedures Procedures (including critical care time)  Medications Ordered in UC Medications - No data to display  Initial Impression / Assessment and Plan / UC Course  I have reviewed the triage vital signs and the nursing notes.  Pertinent labs & imaging results that were available during my care of the patient were reviewed by me and considered in my medical decision making (see chart for details).     Rapid strep is negative.  Throat culture is sent and we will  notify and treat protocol if that is positive  Pregnancy test is negative.  Swab was done for cytology of her oropharynx. Vaginal self swab is done, and we will notify of any positives on that and treat per protocol. Also blood is drawn to check HIV and syphilis and she wanted to have a blood test for pregnancy to confirm.  Zofran  is sent in for the intermittent nausea. Final Clinical Impressions(s) / UC Diagnoses   Final diagnoses:  Acute pharyngitis, unspecified etiology  Potential exposure to STD  Irregular menses  Nausea     Discharge Instructions      The pregnancy test is negative  Your strep test is negative.  Culture of the throat will be sent, and staff will notify you if that is in turn positive.  Staff will notify you if there is anything positive on the swabs(of your throat or vagina) or on the bloodwork.  Take Tylenol  as needed for the sore throat, and Cepacol spray also can help.   ED Prescriptions     Medication Sig Dispense Auth. Provider   ondansetron  (ZOFRAN -ODT) 4 MG disintegrating tablet Take 1 tablet (4 mg total) by mouth every 8 (eight) hours as needed for nausea or vomiting. 10 tablet Ellsworth Haas Danthony Kendrix K, MD      PDMP not reviewed this encounter.   Ann Keto, MD 04/30/24 [phone]

## 2024-04-30 NOTE — ED Triage Notes (Addendum)
"  I have a sore throat (for 2 wks now), also I have not got my period yet so may be pregnant (wish to have blood work), to discuss". "I want tested for strep but also STI's as well". "Would like STI (Oral) swab also".

## 2024-05-01 LAB — CERVICOVAGINAL ANCILLARY ONLY
Chlamydia: NEGATIVE
Comment: NEGATIVE
Comment: NEGATIVE
Comment: NORMAL
Neisseria Gonorrhea: NEGATIVE
Trichomonas: NEGATIVE

## 2024-05-01 LAB — RPR: RPR Ser Ql: NONREACTIVE

## 2024-05-01 LAB — CYTOLOGY, (ORAL, ANAL, URETHRAL) ANCILLARY ONLY
Chlamydia: NEGATIVE
Comment: NEGATIVE
Comment: NORMAL
Neisseria Gonorrhea: NEGATIVE

## 2024-05-01 LAB — BETA HCG QUANT (REF LAB): hCG Quant: <1 m[IU]/mL

## 2024-05-01 LAB — HIV ANTIBODY (ROUTINE TESTING W REFLEX): HIV Screen 4th Generation wRfx: NONREACTIVE

## 2024-05-03 LAB — CULTURE, GROUP A STREP (THRC)

## 2024-05-06 ENCOUNTER — Ambulatory Visit (HOSPITAL_COMMUNITY): Payer: Self-pay

## 2024-06-06 ENCOUNTER — Ambulatory Visit
Admission: EM | Admit: 2024-06-06 | Discharge: 2024-06-06 | Disposition: A | Discharge status: Home / Self Care | Attending: Physician Assistant | Admitting: Physician Assistant

## 2024-06-06 ENCOUNTER — Encounter: Payer: Self-pay | Admitting: Emergency Medicine

## 2024-06-06 DIAGNOSIS — Z793 Long term (current) use of hormonal contraceptives: Secondary | ICD-10-CM | POA: Insufficient documentation

## 2024-06-06 DIAGNOSIS — N926 Irregular menstruation, unspecified: Secondary | ICD-10-CM | POA: Diagnosis not present

## 2024-06-06 DIAGNOSIS — Z87891 Personal history of nicotine dependence: Secondary | ICD-10-CM | POA: Diagnosis not present

## 2024-06-06 DIAGNOSIS — Z3201 Encounter for pregnancy test, result positive: Secondary | ICD-10-CM | POA: Diagnosis not present

## 2024-06-06 DIAGNOSIS — N898 Other specified noninflammatory disorders of vagina: Secondary | ICD-10-CM | POA: Insufficient documentation

## 2024-06-06 LAB — POCT URINE PREGNANCY: Preg Test, Ur: POSITIVE — AB

## 2024-06-06 NOTE — ED Triage Notes (Signed)
 Pt here to confirm pregnancy to start prenatal care. Pregnancy test positive on 6/22. Also concerned about BV and would like to be tested due to slight odor and some new discharge.

## 2024-06-06 NOTE — ED Provider Notes (Signed)
 [facility]    CSN: [id number] Arrival date & time: 06/06/24  1914      History   Chief Complaint Chief Complaint  Patient presents with   Possible Pregnancy    HPI Vicki Daniel is a 28 y.o. female.   Patient here today for pregnancy confirmation. She reports she took a pregnancy test at home that was positive 4 days ago. She would also like BV screening as she has had some new vaginal discharge. She denies concern for STD.   The history is provided by the patient.  Possible Pregnancy Pertinent negatives include no abdominal pain and no shortness of breath.    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   Substance abuse (HCC) 11/26/2017   Acute lower UTI 11/25/2017   Hypokalemia 11/25/2017   Trichomonas vaginitis 11/25/2017   Bacterial vaginosis 11/25/2017   Intractable nausea and vomiting 11/25/2017   Hematemesis 11/25/2017   Cannabinoid hyperemesis syndrome    Allergic rhinitis 09/19/2016   Tonsillolith 09/19/2016   Acne vulgaris 03/02/2015    Past Surgical History:  Procedure Laterality Date   WISDOM TOOTH EXTRACTION      OB History     Gravida  4   Para  1   Term  1   Preterm  0   AB  3   Living  1      SAB  2   IAB  1   Ectopic  0   Multiple  0   Live Births  1            Home Medications    Prior to Admission medications   Medication Sig Start Date End Date Taking? Authorizing Provider  tretinoin (RETIN-A) 0.025 % cream Apply 1 Application topically at bedtime. 03/09/24  Yes [provider]  albuterol  (VENTOLIN  HFA) 108 (90 Base) MCG/ACT inhaler Use 2 puffs every 4-6 hours as needed for wheezing. 04/10/22   Christopher Savannah, PA-C  Azelastine -Fluticasone  137-50 MCG/ACT SUSP Place 1 spray into both nostrils 2 (two) times daily.    [provider]   diphenhydrAMINE  (BENADRYL  ALLERGY) 25 mg capsule Take 25 mg by mouth every 6 (six) hours as needed. Patient not taking: Reported on 06/06/2024 03/02/15   [provider]  ethynodiol-ethinyl estradiol (KELNOR 1/35) 1-35 MG-MCG tablet Take 1 tablet by mouth daily. 01/25/17   [provider]  fexofenadine (ALLEGRA) 60 MG tablet Take 60 mg by mouth 2 (two) times daily. 03/02/15   [provider]  fluticasone  (FLONASE ) 50 MCG/ACT nasal spray Place 1 spray into both nostrils daily. 12/10/22   Hazen Darryle BRAVO, FNP  guaiFENesin  200 MG tablet Take 1 tablet (200 mg total) by mouth every 4 (four) hours as needed for cough or to loosen phlegm. 12/10/22   Hazen Darryle BRAVO, FNP  metroNIDAZOLE  (METROGEL ) 0.75 % vaginal gel Place 1 Applicatorful vaginally as directed. Patient not taking: Reported on 06/06/2024 04/08/24   [provider]  ondansetron  (ZOFRAN -ODT) 4 MG disintegrating tablet Take 1 tablet (4 mg total) by mouth every 8 (eight) hours as needed for nausea or vomiting. Patient not taking: Reported on 06/06/2024 04/30/24   Vonna Sharlet POUR, MD  phenol (CHLORASEPTIC) 1.4 % LIQD Use as directed 1 spray in the mouth or throat as needed for throat irritation / pain. 12/10/22   [place],  Darryle BRAVO, FNP  Prenatal Vit-Fe Fumarate-FA (PREPLUS) 27-1 MG TABS Take by mouth.    [provider]  spironolactone (ALDACTONE) 50 MG tablet Take 50 mg by mouth daily. 02/12/24   [provider]  tretinoin (RETIN-A) 0.05 % cream Apply 1 Application topically at bedtime. 03/02/15   [provider]  TRI-ESTARYLLA 0.18/0.215/0.25 MG-35 MCG tablet Take 1 tablet by mouth daily. 02/12/24   [provider]  valACYclovir (VALTREX) 1000 MG tablet Take 500 mg by mouth 2 (two) times daily as needed. Patient not taking: Reported on 06/06/2024 05/02/24   [provider]  promethazine  (PHENERGAN ) 25 MG suppository Place 1 suppository (25 mg total) rectally every 6 (six) hours as  needed for nausea or vomiting. Patient not taking: Reported on 07/18/2020 11/23/17 07/18/20  Emil Share, DO    Family History Family History  Problem Relation Age of Onset   Hypertension Father     Social History Social History   Tobacco Use   Smoking status: Never   Smokeless tobacco: Never  Vaping Use   Vaping status: Former   Substances: Nicotine, Flavoring  Substance Use Topics   Alcohol use: Not Currently   Drug use: Not Currently    Types: Marijuana    Comment: stopped a week before found out pregnant     Allergies   Patient has no known allergies.   Review of Systems Review of Systems  Constitutional:  Negative for chills and fever.  Eyes:  Negative for discharge and redness.  Respiratory:  Negative for shortness of breath.   Gastrointestinal:  Negative for abdominal pain, nausea and vomiting.  Genitourinary:  Positive for vaginal discharge. Negative for genital sores and vaginal bleeding.     Physical Exam Triage Vital Signs ED Triage Vitals  Encounter Vitals Group     BP 06/06/24 1924 103/74     Girls Systolic BP Percentile --      Girls Diastolic BP Percentile --      Boys Systolic BP Percentile --      Boys Diastolic BP Percentile --      Pulse Rate 06/06/24 1924 74     Resp 06/06/24 1924 16     Temp 06/06/24 1924 98.5 F (36.9 C)     Temp Source 06/06/24 1924 Oral     SpO2 06/06/24 1924 99 %     Weight --      Height --      Head Circumference --      Peak Flow --      Pain Score 06/06/24 1925 5     Pain Loc --      Pain Education --      Exclude from Growth Chart --    No data found.  Updated Vital Signs BP 103/74 (BP Location: Left Arm)   Pulse 74   Temp 98.5 F (36.9 C) (Oral)   Resp 16   LMP 05/02/2024 (Exact Date)   SpO2 99%   Breastfeeding No   Visual Acuity Right Eye Distance:   Left Eye Distance:   Bilateral Distance:    Right Eye Near:   Left Eye Near:    Bilateral Near:     Physical Exam Vitals and nursing note  reviewed.  Constitutional:      General: She is not in acute distress.    Appearance: Normal appearance. She is not ill-appearing.  HENT:     Head: Normocephalic and atraumatic.   Eyes:     Conjunctiva/sclera: Conjunctivae normal.  Cardiovascular:     Rate and Rhythm: Normal rate.  Pulmonary:     Effort: Pulmonary effort is normal. No respiratory distress.   Neurological:     Mental Status: She is alert.   Psychiatric:        Mood and Affect: Mood normal.        Behavior: Behavior normal.        Thought Content: Thought content normal.      UC Treatments / Results  Labs (all labs ordered are listed, but only abnormal results are displayed) Labs Reviewed  POCT URINE PREGNANCY - Abnormal; Notable for the following components:      Result Value   Preg Test, Ur Positive (*)    All other components within normal limits  CERVICOVAGINAL ANCILLARY ONLY    EKG   Radiology No results found.  Procedures Procedures (including critical care time)  Medications Ordered in UC Medications - No data to display  Initial Impression / Assessment and Plan / UC Course  I have reviewed the triage vital signs and the nursing notes.  Pertinent labs & imaging results that were available during my care of the patient were reviewed by me and considered in my medical decision making (see chart for details).    Pregnancy screening positive. Patient to follow up with OB. Screening ordered for BV, yeast, gonorrhea, chlamydia and trichomonas. Will await results for further recommendation.   Final Clinical Impressions(s) / UC Diagnoses   Final diagnoses:  Missed period  Positive pregnancy test  Vaginal discharge   Discharge Instructions   None    ED Prescriptions   None    PDMP not reviewed this encounter.   Billy Asberry FALCON, PA-C 06/06/24 1944

## 2024-06-07 LAB — CERVICOVAGINAL ANCILLARY ONLY
Bacterial Vaginitis (gardnerella): POSITIVE — AB
Candida Glabrata: NEGATIVE
Candida Vaginitis: NEGATIVE
Chlamydia: NEGATIVE
Comment: NEGATIVE
Comment: NEGATIVE
Comment: NEGATIVE
Comment: NEGATIVE
Comment: NEGATIVE
Comment: NORMAL
Neisseria Gonorrhea: NEGATIVE
Trichomonas: NEGATIVE

## 2024-06-10 ENCOUNTER — Ambulatory Visit (HOSPITAL_COMMUNITY): Payer: Self-pay

## 2024-06-10 MED ORDER — METRONIDAZOLE 0.75 % VA GEL: 1.0000 | Freq: Every day | VAGINAL | 0 refills | Status: AC

## 2024-07-04 ENCOUNTER — Inpatient Hospital Stay (HOSPITAL_COMMUNITY)
Admission: AD | Admit: 2024-07-04 | Discharge: 2024-07-04 | Discharge status: Against Medical Advice | Attending: Obstetrics and Gynecology | Admitting: Obstetrics and Gynecology

## 2024-07-04 NOTE — MAU Note (Signed)
 Registration called to inform RN that patient left prior to being seen.

## 2024-07-10 DIAGNOSIS — N898 Other specified noninflammatory disorders of vagina: Secondary | ICD-10-CM | POA: Diagnosis not present

## 2024-07-10 DIAGNOSIS — B9689 Other specified bacterial agents as the cause of diseases classified elsewhere: Secondary | ICD-10-CM | POA: Diagnosis not present

## 2024-07-10 DIAGNOSIS — N760 Acute vaginitis: Secondary | ICD-10-CM | POA: Diagnosis not present

## 2024-07-10 DIAGNOSIS — Z113 Encounter for screening for infections with a predominantly sexual mode of transmission: Secondary | ICD-10-CM | POA: Diagnosis not present

## 2024-07-12 DIAGNOSIS — Z3A10 10 weeks gestation of pregnancy: Secondary | ICD-10-CM | POA: Diagnosis not present

## 2024-07-12 DIAGNOSIS — Z3481 Encounter for supervision of other normal pregnancy, first trimester: Secondary | ICD-10-CM | POA: Diagnosis not present

## 2024-07-12 DIAGNOSIS — O26891 Other specified pregnancy related conditions, first trimester: Secondary | ICD-10-CM | POA: Diagnosis not present

## 2024-07-12 DIAGNOSIS — Z369 Encounter for antenatal screening, unspecified: Secondary | ICD-10-CM | POA: Diagnosis not present

## 2024-07-24 ENCOUNTER — Encounter: Payer: Self-pay | Admitting: *Deleted

## 2024-07-25 ENCOUNTER — Encounter

## 2024-07-29 ENCOUNTER — Ambulatory Visit (INDEPENDENT_AMBULATORY_CARE_PROVIDER_SITE_OTHER)

## 2024-07-29 ENCOUNTER — Other Ambulatory Visit (HOSPITAL_COMMUNITY)
Admission: RE | Admit: 2024-07-29 | Discharge: 2024-07-29 | Disposition: A | Discharge status: Home / Self Care | Source: Ambulatory Visit | Attending: Obstetrics and Gynecology | Admitting: Obstetrics and Gynecology

## 2024-07-29 VITALS — BP 135/89 | HR 89

## 2024-07-29 DIAGNOSIS — N898 Other specified noninflammatory disorders of vagina: Secondary | ICD-10-CM

## 2024-07-29 NOTE — Progress Notes (Signed)
 SUBJECTIVE:  28 y.o. female complains of white vaginal discharge for 1 week(s). Denies abnormal vaginal bleeding or significant pelvic pain or fever. No UTI symptoms. Denies history of known exposure to STD.  Patient's last menstrual period was 05/02/2024 (exact date).  OBJECTIVE:  She appears well, afebrile. Urine dipstick: not done.  ASSESSMENT:  Vaginal Discharge  Vaginal Odor   PLAN:  GC, chlamydia, trichomonas, BVAG, CVAG probe sent to lab. Treatment: To be determined once lab results are received ROV prn if symptoms persist or worsen.

## 2024-07-31 LAB — CERVICOVAGINAL ANCILLARY ONLY
Bacterial Vaginitis (gardnerella): NEGATIVE
Candida Glabrata: NEGATIVE
Candida Vaginitis: NEGATIVE
Chlamydia: NEGATIVE
Comment: NEGATIVE
Comment: NEGATIVE
Comment: NEGATIVE
Comment: NEGATIVE
Comment: NEGATIVE
Comment: NORMAL
Neisseria Gonorrhea: NEGATIVE
Trichomonas: NEGATIVE

## 2024-08-05 DIAGNOSIS — Z3480 Encounter for supervision of other normal pregnancy, unspecified trimester: Secondary | ICD-10-CM | POA: Insufficient documentation

## 2024-08-06 ENCOUNTER — Encounter: Admitting: Obstetrics and Gynecology

## 2024-08-06 DIAGNOSIS — Z3480 Encounter for supervision of other normal pregnancy, unspecified trimester: Secondary | ICD-10-CM

## 2024-08-13 ENCOUNTER — Ambulatory Visit (INDEPENDENT_AMBULATORY_CARE_PROVIDER_SITE_OTHER): Admitting: *Deleted

## 2024-08-13 ENCOUNTER — Other Ambulatory Visit (INDEPENDENT_AMBULATORY_CARE_PROVIDER_SITE_OTHER): Payer: Self-pay

## 2024-08-13 VITALS — BP 118/84 | HR 99 | Wt 226.8 lb

## 2024-08-13 DIAGNOSIS — Z3480 Encounter for supervision of other normal pregnancy, unspecified trimester: Secondary | ICD-10-CM | POA: Insufficient documentation

## 2024-08-13 DIAGNOSIS — Z3A14 14 weeks gestation of pregnancy: Secondary | ICD-10-CM | POA: Diagnosis not present

## 2024-08-13 DIAGNOSIS — Z3482 Encounter for supervision of other normal pregnancy, second trimester: Secondary | ICD-10-CM | POA: Diagnosis not present

## 2024-08-13 DIAGNOSIS — Z362 Encounter for other antenatal screening follow-up: Secondary | ICD-10-CM

## 2024-08-13 MED ORDER — BLOOD PRESSURE KIT DEVI: 1.0000 | 0 refills | Status: DC

## 2024-08-13 NOTE — Progress Notes (Signed)
 New OB Intake  I connected with Glade Anon  on 08/13/24 at  1:10 PM EDT by In Person Visit and verified that I am speaking with the correct person using two identifiers. Nurse is located at [facility] and pt is located at [place].  I discussed the limitations, risks, security and privacy concerns of performing an evaluation and management service by telephone and the availability of in person appointments. I also discussed with the patient that there may be a patient responsible charge related to this service. The patient expressed understanding and agreed to proceed.  I explained I am completing New OB Intake today. We discussed EDD of 02/06/25 based on LMP of 05/02/24. Pt is G5P1031. I reviewed her allergies, medications and Medical/Surgical/OB history.    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 Discussed the nature of our practice with multiple providers including residents and students as well as female and female providers. Due to the size of the practice, the delivering provider may not be the same as those providing prenatal care.   Patient is not interested in water birth.  MyChart/Babyscripts MyChart access verified. I explained pt will have some visits in office and some virtually. Babyscripts instructions given and order placed. Patient verifies receipt of registration text/e-mail. Account successfully  created and app downloaded. If patient is a candidate for Optimized scheduling, add to sticky note.   Blood Pressure Cuff/Weight Scale Blood pressure cuff ordered for patient to pick-up from [employer]. Explained after first prenatal appt pt will check weekly and document in Babyscripts. Patient does not have weight scale; patient may purchase if they desire to track weight weekly in Babyscripts.  Anatomy US  Explained first scheduled US  will be around 19 weeks. Anatomy US  scheduled for TBD at TBD.  Is patient a candidate for Babyscripts Optimization? No, due to Risk Factors   First visit review I reviewed new OB appt with patient. Explained pt will be seen by Jorene Moats, PA at first visit. Discussed [place] genetic screening with patient. Requests Pan[employer] and [employer].. Routine prenatal labs collected at today's visit.   Last Pap No results found for: DIAGPAP  Rocky CHRISTELLA Ober, RN 08/13/2024  1:25 PM

## 2024-08-13 NOTE — Patient Instructions (Signed)

## 2024-08-14 ENCOUNTER — Other Ambulatory Visit (HOSPITAL_COMMUNITY)
Admission: RE | Admit: 2024-08-14 | Discharge: 2024-08-14 | Disposition: A | Discharge status: Home / Self Care | Source: Ambulatory Visit | Attending: Obstetrics and Gynecology | Admitting: Obstetrics and Gynecology

## 2024-08-14 DIAGNOSIS — Z3480 Encounter for supervision of other normal pregnancy, unspecified trimester: Secondary | ICD-10-CM | POA: Diagnosis present

## 2024-08-14 LAB — CBC/D/PLT+RPR+RH+ABO+RUBIGG...
Antibody Screen: NEGATIVE
Basophils Absolute: 0 x10E3/uL (ref 0.0–0.2)
Basos: 0 %
EOS (ABSOLUTE): 0.1 x10E3/uL (ref 0.0–0.4)
Eos: 1 %
HCV Ab: NONREACTIVE
HIV Screen 4th Generation wRfx: NONREACTIVE
Hematocrit: 38.7 % (ref 34.0–46.6)
Hemoglobin: 12.4 g/dL (ref 11.1–15.9)
Hepatitis B Surface Ag: NEGATIVE
Immature Grans (Abs): 0 x10E3/uL (ref 0.0–0.1)
Immature Granulocytes: 0 %
Lymphocytes Absolute: 1.8 x10E3/uL (ref 0.7–3.1)
Lymphs: 21 %
MCH: 30.2 pg (ref 26.6–33.0)
MCHC: 32 g/dL (ref 31.5–35.7)
MCV: 94 fL (ref 79–97)
Monocytes Absolute: 0.5 x10E3/uL (ref 0.1–0.9)
Monocytes: 5 %
Neutrophils Absolute: 6.2 x10E3/uL (ref 1.4–7.0)
Neutrophils: 73 %
Platelets: 259 x10E3/uL (ref 150–450)
RBC: 4.1 x10E6/uL (ref 3.77–5.28)
RDW: 13.6 % (ref 11.7–15.4)
RPR Ser Ql: NONREACTIVE
Rh Factor: NEGATIVE
Rubella Antibodies, IGG: 3.75 {index}
WBC: 8.7 x10E3/uL (ref 3.4–10.8)

## 2024-08-14 LAB — HEMOGLOBIN A1C
Est. average glucose Bld gHb Est-mCnc: 100 mg/dL
Hgb A1c MFr Bld: 5.1 % (ref 4.8–5.6)

## 2024-08-14 LAB — HCV INTERPRETATION

## 2024-08-14 NOTE — Addendum Note (Signed)
 Addended by: Romon Devereux M on: 08/14/2024 12:05 PM   Modules accepted: Orders

## 2024-08-15 ENCOUNTER — Ambulatory Visit: Payer: Self-pay | Admitting: Obstetrics and Gynecology

## 2024-08-15 DIAGNOSIS — Z3480 Encounter for supervision of other normal pregnancy, unspecified trimester: Secondary | ICD-10-CM | POA: Diagnosis not present

## 2024-08-15 LAB — CERVICOVAGINAL ANCILLARY ONLY
Bacterial Vaginitis (gardnerella): NEGATIVE
Candida Glabrata: NEGATIVE
Candida Vaginitis: NEGATIVE
Chlamydia: NEGATIVE
Comment: NEGATIVE
Comment: NEGATIVE
Comment: NEGATIVE
Comment: NEGATIVE
Comment: NEGATIVE
Comment: NORMAL
Neisseria Gonorrhea: NEGATIVE
Trichomonas: NEGATIVE

## 2024-08-15 LAB — CULTURE, OB URINE

## 2024-08-15 LAB — URINE CULTURE, OB REFLEX

## 2024-08-19 LAB — PANORAMA PRENATAL TEST FULL PANEL:PANORAMA TEST PLUS 5 ADDITIONAL MICRODELETIONS: FETAL FRACTION: 7.3

## 2024-08-20 LAB — HORIZON CUSTOM: REPORT SUMMARY: POSITIVE — AB

## 2024-08-21 ENCOUNTER — Other Ambulatory Visit: Payer: Self-pay

## 2024-08-21 DIAGNOSIS — Z141 Cystic fibrosis carrier: Secondary | ICD-10-CM

## 2024-08-29 ENCOUNTER — Encounter: Admitting: Physician Assistant

## 2024-08-29 NOTE — Progress Notes (Deleted)
   PRENATAL VISIT NOTE  Subjective:  Vicki Daniel is a 28 y.o. G5P1031 at [redacted]w[redacted]d being seen today for ongoing prenatal care.  She is currently monitored for the following issues for this {Blank single:19197::high-risk,low-risk} pregnancy and has Acute lower UTI; Hypokalemia; Trichomonas vaginitis; Bacterial vaginosis; Intractable nausea and vomiting; Cannabinoid hyperemesis syndrome; Hematemesis; Substance abuse (HCC); Cystic fibrosis carrier; [redacted] weeks gestation of pregnancy; Acne vulgaris; Allergic rhinitis; Tonsillolith; and Supervision of other normal pregnancy, antepartum on their problem list.  Patient reports {sx:[id number]}.   .  .   . Denies leaking of fluid.   The following portions of the patient's history were reviewed and updated as appropriate: allergies, current medications, past family history, past medical history, past social history, past surgical history and problem list.   Objective:    There were no vitals filed for this visit.  Fetal Status:           General: Alert, oriented and cooperative. Patient is in no acute distress.  Skin: Skin is warm and dry. No rash noted.   Cardiovascular: Normal heart rate noted  Respiratory: Normal respiratory effort, no problems with respiration noted  Abdomen: Soft, gravid, appropriate for gestational age.        Pelvic: {Blank single:19197::Cervical exam performed in the presence of a chaperone,Cervical exam deferred}        Extremities: Normal range of motion.     Mental Status: Normal mood and affect. Normal behavior. Normal judgment and thought content.   Assessment and Plan:  Pregnancy: G5P1031 at [redacted]w[redacted]d  1. Supervision of other normal pregnancy, antepartum (Primary) ***  2. [redacted] weeks gestation of pregnancy ***  3. Cystic fibrosis carrier ***  {Blank single:19197::Term,Preterm} labor symptoms and general obstetric precautions including but not limited to vaginal bleeding, contractions, leaking of fluid and fetal  movement were reviewed in detail with the patient.  Please refer to After Visit Summary for other counseling recommendations.   No follow-ups on file.  Future Appointments  Date Time Provider Department Center  08/29/2024  8:55 AM Jhoselyn Ruffini E, PA-C [facility] None  09/24/2024  7:00 AM [facility] PROVIDER 1 [facility]  09/24/2024  7:30 AM [facility] US4 [facility]    Jorene FORBES Moats, PA-C

## 2024-09-11 ENCOUNTER — Other Ambulatory Visit: Payer: Self-pay

## 2024-09-11 ENCOUNTER — Ambulatory Visit
Admission: EM | Admit: 2024-09-11 | Discharge: 2024-09-11 | Disposition: A | Discharge status: Home / Self Care | Attending: Nurse Practitioner | Admitting: Nurse Practitioner

## 2024-09-11 DIAGNOSIS — N898 Other specified noninflammatory disorders of vagina: Secondary | ICD-10-CM

## 2024-09-11 DIAGNOSIS — Z3A18 18 weeks gestation of pregnancy: Secondary | ICD-10-CM

## 2024-09-11 DIAGNOSIS — O99210 Obesity complicating pregnancy, unspecified trimester: Secondary | ICD-10-CM | POA: Insufficient documentation

## 2024-09-11 DIAGNOSIS — R300 Dysuria: Secondary | ICD-10-CM

## 2024-09-11 LAB — POCT URINE DIPSTICK
Bilirubin, UA: NEGATIVE
Glucose, UA: NEGATIVE mg/dL
Ketones, POC UA: NEGATIVE mg/dL
Nitrite, UA: NEGATIVE
POC PROTEIN,UA: 30 — AB
Spec Grav, UA: >=1.03 — AB (ref 1.010–1.025)
Urobilinogen, UA: 0.2 U/dL
pH, UA: 6 (ref 5.0–8.0)

## 2024-09-11 LAB — POCT URINE PREGNANCY: Preg Test, Ur: POSITIVE — AB

## 2024-09-11 MED ORDER — MICONAZOLE NITRATE 100 MG VA SUPP: 100.0000 mg | Freq: Every day | VAGINAL | 0 refills | Status: DC

## 2024-09-11 MED ORDER — HYDROCORTISONE 1 % EX OINT
TOPICAL_OINTMENT | CUTANEOUS | 0 refills | Status: DC
Start: 2024-09-11 — End: 2024-11-07

## 2024-09-11 NOTE — ED Triage Notes (Signed)
 Reports 4 days of vaginal discharge with itching and burning. Also reports cloudy urine. Pt is 5 months pregnant. States she is followed by Elie for [facility] care

## 2024-09-11 NOTE — ED Provider Notes (Signed)
 [facility]    CSN: [id number] Arrival date & time: 09/11/24  0902      History   Chief Complaint Chief Complaint  Patient presents with   Vaginal Discharge    HPI Vicki Daniel is a 28 y.o. female.   Discussed the use of AI scribe software for clinical note transcription with the patient, who declined the use of this technology.   The patient is an 18-week pregnant female presenting with a 4-day history of severe vaginal itching and irritation. She also reports creamy vaginal discharge and cloudy urine. A mild odor is present but not described as fishy. She notes mild dysuria without associated urinary frequency or urgency. She also has left lower back pain, which she reports has been chronic throughout the pregnancy and unchanged from baseline. She denies nausea, vomiting, abdominal pain, or fever.  The patient has had several prior encounters over the past couple of months for similar symptoms. Cervical-vaginal testing in May, August, and September were all negative for bacterial vaginosis, yeast, or sexually transmitted infections. She did test positive for bacterial vaginosis in June but states that her current symptoms feel different from that episode. Today, she strongly believes her symptoms are consistent with a yeast infection. She has not used any treatments for her current symptoms.  The following sections of the patient's history were reviewed and updated as appropriate: allergies, current medications, past family history, past medical history, past social history, past surgical history, and problem list.      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   Substance abuse (HCC) 11/26/2017   Acute lower UTI 11/25/2017   Hypokalemia  11/25/2017   Trichomonas vaginitis 11/25/2017   Bacterial vaginosis 11/25/2017   Intractable nausea and vomiting 11/25/2017   Hematemesis 11/25/2017   Cannabinoid hyperemesis syndrome    Allergic rhinitis 09/19/2016   Tonsillolith 09/19/2016   Acne vulgaris 03/02/2015    Past Surgical History:  Procedure Laterality Date   WISDOM TOOTH EXTRACTION      OB History     Gravida  5   Para  1   Term  1   Preterm  0   AB  3   Living  1      SAB  2   IAB  1   Ectopic  0   Multiple  0   Live Births  1            Home Medications    Prior to Admission medications   Medication Sig Start Date End Date Taking? Authorizing Provider  hydrocortisone 1 % ointment Apply a very thin layer to the external vaginal area twice a day for 1 week. 09/11/24  Yes Iola Lukes, FNP  miconazole (MICOTIN) 100 MG vaginal suppository Place 1 suppository (100 mg total) vaginally at bedtime. 09/11/24  Yes Iola Lukes, FNP  Prenatal Vit-Fe Fumarate-FA (PRENATAL VITAMIN PO) Take 1 tablet by mouth daily.   Yes [provider]  Blood Pressure Monitoring (BLOOD PRESSURE KIT) DEVI 1 Device by Does not apply route once a week. 08/13/24   Zina Jerilynn LABOR, MD  promethazine  (PHENERGAN ) 25 MG suppository Place 1 suppository (25 mg total) rectally every 6 (six) hours as needed for nausea or vomiting. Patient not taking: Reported  on 07/18/2020 11/23/17 07/18/20  Emil Share, DO    Family History Family History  Problem Relation Age of Onset   Hypertension Father     Social History Social History   Tobacco Use   Smoking status: Never   Smokeless tobacco: Never  Vaping Use   Vaping status: Former   Substances: Nicotine, Flavoring  Substance Use Topics   Alcohol use: Not Currently   Drug use: Not Currently    Types: Marijuana    Comment: stopped a week before found out pregnant     Allergies   Patient has no known allergies.   Review of Systems Review of Systems   Constitutional:  Negative for fever.  Gastrointestinal:  Negative for nausea and vomiting.  Genitourinary:  Positive for dysuria and vaginal discharge (creamy, white). Negative for genital sores and urgency.       Severe vaginal itching and irritation. Mild odor but not fishy.    Musculoskeletal:  Positive for back pain (lower left, ongoing throughout pregnancy and unchanged from usual).  All other systems reviewed and are negative.    Physical Exam Triage Vital Signs ED Triage Vitals  Encounter Vitals Group     BP      Girls Systolic BP Percentile      Girls Diastolic BP Percentile      Boys Systolic BP Percentile      Boys Diastolic BP Percentile      Pulse      Resp      Temp      Temp src      SpO2      Weight      Height      Head Circumference      Peak Flow      Pain Score      Pain Loc      Pain Education      Exclude from Growth Chart    No data found.  Updated Vital Signs BP 100/68 (BP Location: Left Arm)   Pulse 77   Temp 98.4 F (36.9 C) (Oral)   Resp 18   LMP 05/02/2024 (Exact Date)   SpO2 98%   Breastfeeding No   Visual Acuity Right Eye Distance:   Left Eye Distance:   Bilateral Distance:    Right Eye Near:   Left Eye Near:    Bilateral Near:     Physical Exam Constitutional:      General: She is not in acute distress.    Appearance: Normal appearance. She is not ill-appearing, toxic-appearing or diaphoretic.  HENT:     Head: Normocephalic.     Nose: Nose normal.     Mouth/Throat:     Mouth: Mucous membranes are moist.  Eyes:     Conjunctiva/sclera: Conjunctivae normal.  Cardiovascular:     Rate and Rhythm: Normal rate.  Pulmonary:     Effort: Pulmonary effort is normal.  Abdominal:     Palpations: Abdomen is soft.  Genitourinary:    Comments: Deferred; patient performed self-swab for Aptima testing  Musculoskeletal:        General: Normal range of motion.     Cervical back: Normal range of motion and neck supple.  Skin:     General: Skin is warm and dry.  Neurological:     General: No focal deficit present.     Mental Status: She is alert and oriented to person, place, and time.  Psychiatric:        Mood and Affect:  Mood normal.        Behavior: Behavior normal.      UC Treatments / Results  Labs (all labs ordered are listed, but only abnormal results are displayed) Labs Reviewed  POCT URINE DIPSTICK - Abnormal; Notable for the following components:      Result Value   Clarity, UA cloudy (*)    Spec Grav, UA >=1.030 (*)    Blood, UA trace-intact (*)    POC PROTEIN,UA =30 (*)    Leukocytes, UA Small (1+) (*)    All other components within normal limits  POCT URINE PREGNANCY - Abnormal; Notable for the following components:   Preg Test, Ur Positive (*)    All other components within normal limits  URINE CULTURE  CERVICOVAGINAL ANCILLARY ONLY    EKG   Radiology No results found.  Procedures Procedures (including critical care time)  Medications Ordered in UC Medications - No data to display  Initial Impression / Assessment and Plan / UC Course  I have reviewed the triage vital signs and the nursing notes.  Pertinent labs & imaging results that were available during my care of the patient were reviewed by me and considered in my medical decision making (see chart for details).   The patient presents with severe vaginal itching and irritation with associated creamy discharge and cloudy urine. Urinalysis reveals cloudy urine with small leukocytes and trace red blood cells, and cervical-vaginal testing is pending. Given her pregnancy status, urine culture results will be awaited before starting antibiotic therapy for possible urinary tract infection. Due to the intensity of her vaginal symptoms, miconazole vaginal suppositories were prescribed nightly for seven days, along with hydrocortisone cream applied in a thin layer to the external vaginal area twice daily, with instructions to avoid  intravaginal use. She was advised to maintain hydration and to use barrier protection if sexually active to help decrease recurrence risk. The patient was informed she will only be contacted if results are abnormal, though all results can be reviewed through her MyChart account. She was instructed to follow up with her gynecologist if symptoms do not improve and to seek prompt medical attention if she develops fever, pelvic pain, or worsening discharge.  Today's evaluation has revealed no signs of a dangerous process. Discussed diagnosis with patient and/or guardian. Patient and/or guardian aware of their diagnosis, possible red flag symptoms to watch out for and need for close follow up. Patient and/or guardian understands verbal and written discharge instructions. Patient and/or guardian comfortable with plan and disposition.  Patient and/or guardian has a clear mental status at this time, good insight into illness (after discussion and teaching) and has clear judgment to make decisions regarding their care  Documentation was completed with the aid of voice recognition software. Transcription may contain typographical errors.   Final Clinical Impressions(s) / UC Diagnoses   Final diagnoses:  Vaginal discharge  Dysuria  [redacted] weeks gestation of pregnancy  Vaginal itching  Vaginal irritation     Discharge Instructions      You were seen today for vaginal itching, irritation, and some discharge. Your urine test showed cloudy urine with a small amount of white blood cells and trace blood. A culture has been sent to the lab, and because you are pregnant, we will wait for those results before deciding if you need treatment for a urinary tract infection. A vaginal swab was also sent to check for infection, and those results are pending as well.  For your current symptoms, you  have been prescribed treatment for possible yeast infections which include miconazole vaginal suppositories to use once nightly  for seven nights and hydrocortisone cream to apply in a very thin layer to the outside of the vaginal area twice a day. Do not place this cream inside the vagina. Staying well hydrated and using condoms if you are sexually active can help reduce the risk of infections returning.  You will only be contacted if your test results are abnormal, but all results will be available for you to review in your MyChart account. Please follow up with your primary care provider or OB/GYN if your symptoms do not improve after treatment. Go to the emergency room right away if you develop fever, worsening pelvic pain, or increased discharge.     ED Prescriptions     Medication Sig Dispense Auth. Provider   miconazole (MICOTIN) 100 MG vaginal suppository Place 1 suppository (100 mg total) vaginally at bedtime. 7 suppository Mylz Yuan, Hagerman, FNP   hydrocortisone 1 % ointment Apply a very thin layer to the external vaginal area twice a day for 1 week. 15 g Iola Lukes, FNP      PDMP not reviewed this encounter.   Iola Lukes, [place] 09/11/24 1102

## 2024-09-11 NOTE — Discharge Instructions (Addendum)
 You were seen today for vaginal itching, irritation, and some discharge. Your urine test showed cloudy urine with a small amount of white blood cells and trace blood. A culture has been sent to the lab, and because you are pregnant, we will wait for those results before deciding if you need treatment for a urinary tract infection. A vaginal swab was also sent to check for infection, and those results are pending as well.  For your current symptoms, you have been prescribed treatment for possible yeast infections which include miconazole vaginal suppositories to use once nightly for seven nights and hydrocortisone cream to apply in a very thin layer to the outside of the vaginal area twice a day. Do not place this cream inside the vagina. Staying well hydrated and using condoms if you are sexually active can help reduce the risk of infections returning.  You will only be contacted if your test results are abnormal, but all results will be available for you to review in your MyChart account. Please follow up with your primary care provider or OB/GYN if your symptoms do not improve after treatment. Go to the emergency room right away if you develop fever, worsening pelvic pain, or increased discharge.

## 2024-09-12 ENCOUNTER — Ambulatory Visit (HOSPITAL_COMMUNITY): Payer: Self-pay

## 2024-09-12 LAB — URINE CULTURE

## 2024-09-12 LAB — CERVICOVAGINAL ANCILLARY ONLY
Bacterial Vaginitis (gardnerella): NEGATIVE
Candida Glabrata: NEGATIVE
Candida Vaginitis: POSITIVE — AB
Chlamydia: NEGATIVE
Comment: NEGATIVE
Comment: NEGATIVE
Comment: NEGATIVE
Comment: NEGATIVE
Comment: NEGATIVE
Comment: NORMAL
Neisseria Gonorrhea: NEGATIVE
Trichomonas: NEGATIVE

## 2024-09-18 ENCOUNTER — Ambulatory Visit: Admitting: Physician Assistant

## 2024-09-18 VITALS — BP 117/71 | HR 64 | Wt 229.0 lb

## 2024-09-18 DIAGNOSIS — Z3A19 19 weeks gestation of pregnancy: Secondary | ICD-10-CM | POA: Diagnosis not present

## 2024-09-18 DIAGNOSIS — Z3480 Encounter for supervision of other normal pregnancy, unspecified trimester: Secondary | ICD-10-CM

## 2024-09-18 DIAGNOSIS — Z6833 Body mass index (BMI) 33.0-33.9, adult: Secondary | ICD-10-CM

## 2024-09-18 DIAGNOSIS — Z141 Cystic fibrosis carrier: Secondary | ICD-10-CM

## 2024-09-18 NOTE — Progress Notes (Addendum)
 PRENATAL VISIT NOTE  Subjective:  Vicki Daniel is a 28 y.o. G5P1031 at [redacted]w[redacted]d being seen today for her first prenatal visit for this pregnancy.  She is currently monitored for the following issues for this low-risk pregnancy and has Substance abuse (HCC); Cystic fibrosis carrier; Allergic rhinitis; Tonsillolith; Supervision of other normal pregnancy, antepartum; and Obesity in pregnancy on their problem list.  Patient reports no complaints.  Contractions: Not present. Vag. Bleeding: None.  Movement: Present. Denies leaking of fluid.   The following portions of the patient's history were reviewed and updated as appropriate: allergies, current medications, past family history, past medical history, past social history, past surgical history and problem list.   Objective:   Vitals:   09/18/24 1322  BP: 117/71  Pulse: 64  Weight: 229 lb (103.9 kg)    Fetal Status: Fetal Heart Rate (bpm): 144 Fundal Height: 19 cm Movement: Present     General:  Alert, oriented and cooperative. Patient is in no acute distress.  Skin: Skin is warm and dry. No rash noted.   Cardiovascular: Normal heart rate and rhythm noted  Respiratory: Normal respiratory effort, no problems with respiration noted. Clear to auscultation.   Abdomen: Soft, gravid, appropriate for gestational age. Normal bowel sounds. Non-tender. Pain/Pressure: Absent     Pelvic: Cervical exam deferred       Normal cervical contour, no lesions, no bleeding following pap, normal discharge  Extremities: Normal range of motion.  Edema: Trace  Mental Status: Normal mood and affect. Normal behavior. Normal judgment and thought content.    Indications for ASA therapy (per uptodate) One of the following: Previous pregnancy with preeclampsia, especially early onset and with an adverse outcome No Multifetal gestation No Chronic hypertension No Type 1 or 2 diabetes mellitus No Chronic kidney disease No Autoimmune disease (antiphospholipid  syndrome, systemic lupus erythematosus) No  Two or more of the following: Nulliparity No Obesity (body mass index >30 kg/m2) Yes Family history of preeclampsia in mother or sister No Age >=35 years No Sociodemographic characteristics (African American race, low socioeconomic level) No Personal risk factors (eg, previous pregnancy with low birth weight or small for gestational age infant, previous adverse pregnancy outcome [eg, stillbirth], interval >10 years between pregnancies) No   Assessment and Plan:  Pregnancy: G5P1031 at [redacted]w[redacted]d  1. Supervision of other normal pregnancy, antepartum (Primary) Initial labs reviewed.  Continue prenatal vitamins. Genetic Screening discussed: NIPS, carrier screening and AFP  Ultrasound discussed; fetal anatomic survey: 09/24/24 Problem list reviewed and updated. Reviewed Brx optimized schedule, patient agreeable The nature of [facility] with multiple MDs and other Advanced Practice Providers was explained to patient; also emphasized that residents, students are part of our team. Routine obstetric precautions reviewed.  - AFP, Serum, Open Spina Bifida  2. [redacted] weeks gestation of pregnancy Anticipatory guidance about next visits/weeks of pregnancy given.   3. BMI 33.0-33.9,adult  4. Cystic fibrosis carrier Patient politely declines partner testing   Preterm labor/first trimester warning symptoms and general obstetric precautions including but not limited to vaginal bleeding, contractions, leaking of fluid and fetal movement were reviewed in detail with the patient.  Please refer to After Visit Summary for other counseling recommendations.   No follow-ups on file.  Future Appointments  Date Time Provider Department Center  09/24/2024  7:00 AM [facility] PROVIDER 1 [facility]  09/24/2024  7:30 AM [facility] US4 [facility]  09/24/2024  9:00 AM [facility] GENETIC COUNSELING RM [facility]  Tya Haughey E Alison Kubicki, PA-C

## 2024-09-18 NOTE — Progress Notes (Signed)
 Pt presents for new ob. Pt wants to know about weight gain. Pt states that she has not gained any. Pt has no other questions or concerns at this time.

## 2024-09-24 ENCOUNTER — Ambulatory Visit

## 2024-09-24 ENCOUNTER — Other Ambulatory Visit

## 2024-09-24 DIAGNOSIS — O99210 Obesity complicating pregnancy, unspecified trimester: Secondary | ICD-10-CM

## 2024-09-25 ENCOUNTER — Ambulatory Visit: Attending: Maternal & Fetal Medicine | Admitting: Obstetrics and Gynecology

## 2024-09-25 ENCOUNTER — Ambulatory Visit: Payer: Self-pay | Admitting: Obstetrics and Gynecology

## 2024-09-25 DIAGNOSIS — Z141 Cystic fibrosis carrier: Secondary | ICD-10-CM | POA: Diagnosis not present

## 2024-09-25 LAB — AFP, SERUM, OPEN SPINA BIFIDA
AFP MoM: 0.9
AFP Value: 34.1 ng/mL
Gest. Age on Collection Date: 19 wk
Maternal Age At EDD: 27.7 a
OSBR Risk 1 IN: 10000
Weight: 229 [lb_av]

## 2024-09-25 NOTE — Progress Notes (Signed)
 Virtual Visit via Video Note  I connected with Vicki Daniel on 09/25/24 at  1:55pm EDT by a telephone per the patient's request, as she was unable to connect to the video enabled telemedicine application due to cell service issues.  I verified that I am speaking with the correct person using two identifiers.   Location: Patient: home Provider: [facility] Length of Consultation: 20 minutes  Vicki Daniel  was referred to [facility] for genetic counseling to review prenatal screening and testing options, as she is a carrier for Cystic fibrosis.  The patient was present on this visit alone.  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 Vicki Daniel was not found to be a carrier for alpha thalassemia, beta-hemoglobinopathies, or spinal muscular atrophy. Please see report for details. A negative result on carrier screening reduces but does not eliminate the chance of being a carrier.  We reviewed genes, autosomal recessive inheritance of CF, and the clinical features of CF. We discussed that mucus lubricates and protects the linings of the airways, digestive system, reproductive system, and other organs and tissues. When functioning correctly, the CFTR gene provides instructions for a channel that transports chloride ions into and out of cells. The flow of chloride ions helps control the movement of water in the body's tissues, which is necessary for the production of thin, freely flowing mucus. Pathogenic variants in the CFTR gene disrupt the function of the chloride channels, preventing them from regulating the flow of chloride ions and water across cell membranes. Individuals affected with CF therefore have abnormally thick, sticky mucus that cannot easily be cleared from the airways and  digestive system, leading to progressive damage to the respiratory system and chronic digestive system problems. We discussed that common features of CF include respiratory difficulties, bacterial infections in the lungs, the formation of scar tissue (fibrosis) and cysts in the lungs, pancreatic insufficiency, CF-related diabetes mellitus, diarrhea, malnutrition, poor growth, and weight loss. Most men with CF have congenital bilateral absence of the vas deferens (CBAVD) which causes female infertility. With therapies, such as daily respiratory therapies and medications to aid digestion, the median lifespan for people with CF is now in the 40's to 50's. Treatment may involve lung transplantation and CFTR protein modulators in some cases. CF is variably expressed, meaning features of the condition and their severity vary among affected individuals. Expression and severity of CF depends upon the specific mutations present in an affected individual.   Given the autosomal recessive inheritance of CF, we reviewed there would be a 25% chance each pregnancy would be affected if the father of the pregnancy were also found to be a carrier. If both members of a couple are known to be carriers, diagnostic testing through amniocentesis is available to determine if the pregnancy is affected. The benefits, risks, and limitations of amniocentesis including the 1 in 500 procedure related risk were reviewed. We also discussed that CF is included on [facility] 's Newborn Screening Program. The newborn screening test utilizes trypsinogen levels rather than genetic testing to detect affected individuals whereas genetic testing can determine the specific variants, if any, that a child may have inherited. Therefore, the couple may also seek postnatal genetic counseling and testing if needed or desired.   Given these results, we discussed and offered carrier screening for  Vicki Daniel's reproductive partner. She declined at this time, as he  is not available for testing. We discussed other options, including prenatal diagnosis through amniocentesis, single gene NIPS, and postnatal testing. The benefits, risks, and limitations of each were reviewed. Vicki Daniel declined [facility] single gene NIPS and amniocentesis. She elected to wait for the NBS results and for a referral to pediatric genetics if needed.  In the meantime, we calculated the risk of CF to the pregnancy based on our current knowledge of Vicki Daniel's test results and the father of the baby's ethnicity (African American and Native American). The chance that the fetus would be affected with CF would be approximately 1 in 244 (0.5%).   Family history and pregnancy history: We reviewed the detailed family history from Vicki Daniel's prior genetic counseling visit in 2023.  She reported no changes in the family history.  This pregnancy is with a different partner, who is not currently involved.  He is 27 years old and in good health, but she has no medical information about his family.  In this pregnancy, she reported no complications and no exposure to medications (except for a yeast infection), alcohol, tobacco or recreational drugs.    Previous Testing Completed: Low risk NIPS:  Vicki Daniel previously completed noninvasive prenatal screening (NIPS) in this pregnancy. The result is low risk, consistent with a female fetus. This screening significantly reduces but does not eliminate the chance that the current pregnancy has Down syndrome (trisomy 17), trisomy 32, trisomy 30, triploidy, common sex chromosome conditions, and 22q11.2 microdeletion syndrome. Please see report for details. There are many genetic conditions that cannot be detected by NIPS.   Negative ms-AFP screening:  Vicki Daniel previously completed a maternal serum AFP screen in this pregnancy. The result is pending, as the requisition was missing the maternal weight.  I contacted the lab to provide that data and the results should be available in the  next few days. Please see report for details. A negative result would reduce the risk that the current pregnancy has an open neural tube defect. Closed neural tube defects and some open defects may not be detected by this screen.  Plan of Care: Vicki Daniel declined testing on the father of the pregnancy, as he is not currently involved.  She also declined single gene NIPS and amniocentesis for CF testing.  Newborn screening results will be available after birth. Vicki Daniel is scheduled for her anatomy ultrasound on 12/5 at [redacted] weeks gestation. She was unable to make the prior scheduled appointment due to childcare issues.  I have requested that she be added to the call list should earlier appointments become available.  Ms. Iglehart was encouraged to call with questions or concerns.  We can be contacted at [phone].   40 minutes were spent on the date of the encounter in service to the patient including preparation, telephone consultation, discussion of test reports and available next steps, pedigree review, genetic risk assessment, documentation, and care coordination.    Thank you for sharing in the care of Vicki Daniel with us .  Please do not hesitate to contact us  at [phone] if you have any questions.  Barnie PHEBE Dixons, MS, CGC

## 2024-10-17 ENCOUNTER — Encounter: Admitting: Physician Assistant

## 2024-10-23 ENCOUNTER — Other Ambulatory Visit (HOSPITAL_COMMUNITY)
Admission: RE | Admit: 2024-10-23 | Discharge: 2024-10-23 | Disposition: A | Discharge status: Home / Self Care | Source: Ambulatory Visit | Attending: Physician Assistant | Admitting: Physician Assistant

## 2024-10-23 ENCOUNTER — Ambulatory Visit (INDEPENDENT_AMBULATORY_CARE_PROVIDER_SITE_OTHER): Admitting: Physician Assistant

## 2024-10-23 VITALS — BP 116/73 | HR 87 | Wt 233.3 lb

## 2024-10-23 DIAGNOSIS — Z6791 Unspecified blood type, Rh negative: Secondary | ICD-10-CM | POA: Diagnosis not present

## 2024-10-23 DIAGNOSIS — Z3480 Encounter for supervision of other normal pregnancy, unspecified trimester: Secondary | ICD-10-CM | POA: Diagnosis not present

## 2024-10-23 DIAGNOSIS — Z113 Encounter for screening for infections with a predominantly sexual mode of transmission: Secondary | ICD-10-CM

## 2024-10-23 DIAGNOSIS — R350 Frequency of micturition: Secondary | ICD-10-CM

## 2024-10-23 DIAGNOSIS — Z6833 Body mass index (BMI) 33.0-33.9, adult: Secondary | ICD-10-CM | POA: Diagnosis not present

## 2024-10-23 DIAGNOSIS — Z3A24 24 weeks gestation of pregnancy: Secondary | ICD-10-CM

## 2024-10-23 DIAGNOSIS — O26892 Other specified pregnancy related conditions, second trimester: Secondary | ICD-10-CM | POA: Diagnosis not present

## 2024-10-23 LAB — POCT URINALYSIS DIPSTICK
Bilirubin, UA: NEGATIVE
Blood, UA: NEGATIVE
Glucose, UA: NEGATIVE
Ketones, UA: NEGATIVE
Leukocytes, UA: NEGATIVE
Nitrite, UA: NEGATIVE
Protein, UA: NEGATIVE
Spec Grav, UA: 1.02 (ref 1.010–1.025)
Urobilinogen, UA: 2 U/dL
pH, UA: 6 (ref 5.0–8.0)

## 2024-10-23 NOTE — Progress Notes (Signed)
 Pt presents for rob. Pt has no questions or concerns at this time.

## 2024-10-23 NOTE — Progress Notes (Signed)
 PRENATAL VISIT NOTE  Subjective:  Vicki Daniel is a 28 y.o. G5P1031 at [redacted]w[redacted]d being seen today for ongoing prenatal care.  She is currently monitored for the following issues for this low-risk pregnancy and has Substance abuse (HCC); Cystic fibrosis carrier; Allergic rhinitis; Tonsillolith; Supervision of other normal pregnancy, antepartum; Obesity in pregnancy; and Rh negative state in antepartum period, second trimester on their problem list.  Patient reports would like all STI testing due to new partner. No symptoms or reason to think she had an exposure. Would like urine screening due to holding urine for long periods at night, and having to urinate frequently. Patient denies dysuria, urinary urgency.   Contractions: Not present. Vag. Bleeding: None.  Movement: Present. Denies leaking of fluid.   The following portions of the patient's history were reviewed and updated as appropriate: allergies, current medications, past family history, past medical history, past social history, past surgical history and problem list.   Objective:   Vitals:   10/23/24 1446  BP: 116/73  Pulse: 87  Weight: 233 lb 4.8 oz (105.8 kg)    Fetal Status:  Fetal Heart Rate (bpm): 150 Fundal Height: 24 cm Movement: Present    General: Alert, oriented and cooperative. Patient is in no acute distress.  Skin: Skin is warm and dry. No rash noted.   Cardiovascular: Normal heart rate noted  Respiratory: Normal respiratory effort, no problems with respiration noted  Abdomen: Soft, gravid, appropriate for gestational age.  Pain/Pressure: Absent     Pelvic: Cervical exam deferred        Extremities: Normal range of motion.  Edema: Trace  Mental Status: Normal mood and affect. Normal behavior. Normal judgment and thought content.      09/18/2024    2:06 PM 08/13/2024    1:46 PM  Depression screen PHQ 2/9  Decreased Interest 0 0  Down, Depressed, Hopeless 0 0  PHQ - 2 Score 0 0  Altered sleeping 1 0  Tired,  decreased energy 1 0  Change in appetite 0 0  Feeling bad or failure about yourself  0 0  Trouble concentrating 0 0  Moving slowly or fidgety/restless 0 0  Suicidal thoughts 0 0  PHQ-9 Score 2  0      Data saved with a previous flowsheet row definition        09/18/2024    2:06 PM 08/13/2024    1:47 PM  GAD 7 : Generalized Anxiety Score  Nervous, Anxious, on Edge 0 0  Control/stop worrying 0 0  Worry too much - different things 0 0  Trouble relaxing 0 0  Restless 0 0  Easily annoyed or irritable 0 0  Afraid - awful might happen 0 0  Total GAD 7 Score 0 0    Assessment and Plan:  Pregnancy: G5P1031 at [redacted]w[redacted]d  1. Supervision of other normal pregnancy, antepartum (Primary) Patient doing well, feeling regular fetal movement BP, FHR, FH appropriate   2. [redacted] weeks gestation of pregnancy Anticipatory guidance about next visits/weeks of pregnancy given.  GTT next visit- instructions given  3. BMI 33.0-33.9,adult   4. Rh negative state in antepartum period, second trimester Rhogam at 28 weeks and postpartum  5. Routine screening for STI (sexually transmitted infection) - Cervicovaginal ancillary only( [place]) - HIV antibody (with reflex) - RPR - Hepatitis C Antibody - Hepatitis B Surface AntiGEN  6. Urinary frequency - Urine Culture - POCT Urinalysis Dipstick   Preterm labor symptoms and general obstetric precautions including  but not limited to vaginal bleeding, contractions, leaking of fluid and fetal movement were reviewed in detail with the patient.  Please refer to After Visit Summary for other counseling recommendations.   Return in about 3 weeks (around 11/13/2024) for LOB+GTT.  Future Appointments  Date Time Provider Department Center  11/13/2024  8:50 AM [facility] None  11/13/2024 11:15 AM Delores Nidia CROME, FNP [facility] None  11/15/2024  2:00 PM [facility] PROVIDER 1 [facility]  11/15/2024  2:30 PM [facility] US2 [facility]    Jorene FORBES Moats, PA-C

## 2024-10-24 LAB — HEPATITIS C ANTIBODY: Hep C Virus Ab: NONREACTIVE

## 2024-10-24 LAB — CERVICOVAGINAL ANCILLARY ONLY
Bacterial Vaginitis (gardnerella): NEGATIVE
Candida Glabrata: NEGATIVE
Candida Vaginitis: NEGATIVE
Chlamydia: NEGATIVE
Comment: NEGATIVE
Comment: NEGATIVE
Comment: NEGATIVE
Comment: NEGATIVE
Comment: NEGATIVE
Comment: NORMAL
Neisseria Gonorrhea: NEGATIVE
Trichomonas: NEGATIVE

## 2024-10-24 LAB — RPR: RPR Ser Ql: NONREACTIVE

## 2024-10-24 LAB — HEPATITIS B SURFACE ANTIGEN: Hepatitis B Surface Ag: NEGATIVE

## 2024-10-24 LAB — HIV ANTIBODY (ROUTINE TESTING W REFLEX): HIV Screen 4th Generation wRfx: NONREACTIVE

## 2024-10-25 LAB — URINE CULTURE

## 2024-10-27 ENCOUNTER — Ambulatory Visit: Payer: Self-pay | Admitting: Physician Assistant

## 2024-11-07 ENCOUNTER — Other Ambulatory Visit: Payer: Self-pay

## 2024-11-07 ENCOUNTER — Inpatient Hospital Stay (HOSPITAL_COMMUNITY)
Admission: AD | Admit: 2024-11-07 | Discharge: 2024-11-07 | Disposition: A | Discharge status: Home / Self Care | Attending: Obstetrics and Gynecology | Admitting: Obstetrics and Gynecology

## 2024-11-07 DIAGNOSIS — Z3490 Encounter for supervision of normal pregnancy, unspecified, unspecified trimester: Secondary | ICD-10-CM

## 2024-11-07 DIAGNOSIS — Z3A27 27 weeks gestation of pregnancy: Secondary | ICD-10-CM | POA: Insufficient documentation

## 2024-11-07 DIAGNOSIS — Z3492 Encounter for supervision of normal pregnancy, unspecified, second trimester: Secondary | ICD-10-CM | POA: Diagnosis not present

## 2024-11-07 DIAGNOSIS — O26892 Other specified pregnancy related conditions, second trimester: Secondary | ICD-10-CM | POA: Insufficient documentation

## 2024-11-07 NOTE — MAU Provider Note (Signed)
 Chief Complaint:  Stomach Dropped   HPI   None     Vicki Daniel is a 28 y.o. G5P1031 at [redacted]w[redacted]d who presents to maternity admissions reporting the baby changed positions in her belly. She states that normally she feels the baby move at the top of her belly. When she woke up today and while at work, she noticed that she is feeling the baby move near the bottom of her baby. She denies any leakage of fluid, vaginal bleeding, contractions or decreased fetal movement. She also notes that she is on her feet for prolonged periods of time and would like work restrictions so that she doesn't go into labor early.  Pregnancy Course: Receives prenatal care at [facility].  Past Medical History:  Diagnosis Date   [redacted] weeks gestation of pregnancy 07/11/2022   Acne vulgaris 03/02/2015   Acute lower UTI 11/25/2017   Asthma    childhood   Bacterial vaginosis 11/25/2017   Cannabinoid hyperemesis syndrome    Hematemesis 11/25/2017   Hypokalemia 11/25/2017   Intractable nausea and vomiting 11/25/2017   Trichomonas vaginitis 11/25/2017   OB History  Gravida Para Term Preterm AB Living  5 1 1  0 3 1  SAB IAB Ectopic Multiple Live Births  2 1 0 0 1    # Outcome Date GA Lbr Len/2nd Weight Sex Type Anes PTL Lv  5 Current           4 Term 07/13/22 [redacted]w[redacted]d 17:28 / 02:24 3420 g M Vag-Spont EPI, Local  LIV  3 SAB 2022          2 SAB 11/20/17 [redacted]w[redacted]d    SAB     1 IAB 2017           Past Surgical History:  Procedure Laterality Date   WISDOM TOOTH EXTRACTION     Family History  Problem Relation Age of Onset   Hypertension Father    Social History   Tobacco Use   Smoking status: Never   Smokeless tobacco: Never  Vaping Use   Vaping status: Former   Substances: Nicotine, Flavoring  Substance Use Topics   Alcohol use: Not Currently   Drug use: Not Currently    Types: Marijuana    Comment: stopped a week before found out pregnant   No Known Allergies No medications prior to admission.    I have  reviewed patient's Past Medical Hx, Surgical Hx, Family Hx, Social Hx, medications and allergies.   ROS  Pertinent items noted in HPI and remainder of comprehensive ROS otherwise negative.   PHYSICAL EXAM      11/07/2024    1:01 PM 11/07/2024   12:55 PM 10/23/2024    2:46 PM  Vitals with BMI  Height  5' 9   Weight  236 lbs 233 lbs 5 oz  BMI  34.84   Systolic 119  116  Diastolic 66  73  Pulse 77  87      Constitutional: Well-developed, well-nourished female, tearful throughout conversation.  Cardiovascular: Warm and well-perfused Respiratory: normal effort, no problems with respiration noted GI: Gravid MS: Extremities nontender, no edema, normal ROM Neurologic: Alert and oriented x 4.  GU: no CVA tenderness Pelvic: deferred    Pt informed that the ultrasound is considered a limited OB ultrasound and is not intended to be a complete ultrasound exam.  Patient also informed that the ultrasound is not being completed with the intent of assessing for fetal or placental anomalies or any pelvic abnormalities.  Explained that the purpose of today's ultrasound is to assess for  viability.  Patient acknowledges the purpose of the exam and the limitations of the study.    Fetal heart rate of 154bpm    Labs: No results found for this or any previous visit (from the past 24 hours).  Imaging:  No results found.  MDM & MAU COURSE  MDM: Low  MAU Course: Orders Placed This Encounter  Procedures   Discharge patient   No orders of the defined types were placed in this encounter.  VSS. Bedside ultrasound completed for viability and for patient to see fetus as she has not had anatomy ultrasound completed yet. Light duty letter provided to patient. All questions answered prior to discharge.  ASSESSMENT   1. [redacted] weeks gestation of pregnancy   2. Movement of fetus present, antepartum     PLAN  Discharge home in stable condition with return precautions.  Follow up with OB as  scheduled.    Allergies as of 11/07/2024   No Known Allergies      Medication List     STOP taking these medications    Blood Pressure Kit Devi   hydrocortisone  1 % ointment   miconazole  100 MG vaginal suppository Commonly known as: MICOTIN       TAKE these medications    PRENATAL VITAMIN PO Take 1 tablet by mouth daily.        Charlie Courts, MD  Family Medicine - Obstetrics Fellow

## 2024-11-07 NOTE — MAU Note (Signed)
 Vicki Daniel is a 28 y.o. at [redacted]w[redacted]d here in MAU reporting: she feels like her stomach has dropped.  States was feeling fetal in upper abdomen but now feeling movement in lower abdomen and feels sharp pain with movement.  Denies current VB and LOF.  Endorses +FM.  LMP: 05/02/2024 Onset of complaint: today Pain score: 0 Vitals:   11/07/24 1301  BP: 119/66  Pulse: 77  Resp: 18  Temp: 98.2 F (36.8 C)  SpO2: 99%     FHT: 150 bpm  Lab orders placed from triage: None

## 2024-11-13 ENCOUNTER — Encounter: Payer: Self-pay | Admitting: Obstetrics and Gynecology

## 2024-11-13 ENCOUNTER — Encounter: Payer: Self-pay | Admitting: Licensed Clinical Social Worker

## 2024-11-13 ENCOUNTER — Other Ambulatory Visit

## 2024-11-14 ENCOUNTER — Encounter: Admitting: Licensed Clinical Social Worker

## 2024-11-15 ENCOUNTER — Other Ambulatory Visit: Payer: Self-pay | Admitting: *Deleted

## 2024-11-15 ENCOUNTER — Other Ambulatory Visit

## 2024-11-15 ENCOUNTER — Ambulatory Visit

## 2024-11-15 ENCOUNTER — Ambulatory Visit: Attending: Obstetrics and Gynecology

## 2024-11-15 ENCOUNTER — Ambulatory Visit (INDEPENDENT_AMBULATORY_CARE_PROVIDER_SITE_OTHER): Admitting: Family Medicine

## 2024-11-15 ENCOUNTER — Encounter: Payer: Self-pay | Admitting: Family Medicine

## 2024-11-15 VITALS — BP 111/68 | HR 80 | Wt 236.3 lb

## 2024-11-15 VITALS — BP 118/63

## 2024-11-15 DIAGNOSIS — Z3A28 28 weeks gestation of pregnancy: Secondary | ICD-10-CM

## 2024-11-15 DIAGNOSIS — O280 Abnormal hematological finding on antenatal screening of mother: Secondary | ICD-10-CM

## 2024-11-15 DIAGNOSIS — Z3480 Encounter for supervision of other normal pregnancy, unspecified trimester: Secondary | ICD-10-CM

## 2024-11-15 DIAGNOSIS — O99210 Obesity complicating pregnancy, unspecified trimester: Secondary | ICD-10-CM

## 2024-11-15 DIAGNOSIS — F419 Anxiety disorder, unspecified: Secondary | ICD-10-CM | POA: Insufficient documentation

## 2024-11-15 DIAGNOSIS — O99213 Obesity complicating pregnancy, third trimester: Secondary | ICD-10-CM | POA: Diagnosis not present

## 2024-11-15 DIAGNOSIS — Z362 Encounter for other antenatal screening follow-up: Secondary | ICD-10-CM

## 2024-11-15 DIAGNOSIS — O99343 Other mental disorders complicating pregnancy, third trimester: Secondary | ICD-10-CM

## 2024-11-15 DIAGNOSIS — F32A Depression, unspecified: Secondary | ICD-10-CM | POA: Diagnosis not present

## 2024-11-15 DIAGNOSIS — Z363 Encounter for antenatal screening for malformations: Secondary | ICD-10-CM | POA: Diagnosis not present

## 2024-11-15 DIAGNOSIS — O09893 Supervision of other high risk pregnancies, third trimester: Secondary | ICD-10-CM | POA: Diagnosis not present

## 2024-11-15 MED ORDER — HYDROXYZINE HCL 25 MG PO TABS: 25.0000 mg | ORAL_TABLET | Freq: Every day | ORAL | 2 refills | Status: DC

## 2024-11-15 MED ORDER — SERTRALINE HCL 50 MG PO TABS: 50.0000 mg | ORAL_TABLET | Freq: Every day | ORAL | 0 refills | Status: DC

## 2024-11-15 NOTE — Progress Notes (Signed)
 MFM Consult Note  Vicki Daniel is currently at [redacted]w[redacted]d. She was seen today for a detailed fetal anatomy scan due to maternal obesity with a BMI of 33.3.  She missed her earlier appointment for a fetal anatomy scan.  Her [facility] screening test indicated that she is a carrier for cystic fibrosis.  She denies any significant past medical history and denies any problems in her current pregnancy.    She had a cell free DNA test earlier in her pregnancy which indicated a low risk for trisomy 38, 49, and 13. A female fetus is predicted.   Sonographic findings Single intrauterine pregnancy at 28w 1d. Fetal cardiac activity:  Observed and appears normal. Presentation: Transverse, head to maternal left. The views of the fetal anatomy were limited today due to her advanced gestational age.  What was visualized today appeared within normal limits. Fetal biometry shows the estimated fetal weight of 2 lb 7 oz, 1094 grams (19%). Amniotic fluid: Within normal limits. AFI: 13.53 cm.  MVP: 6.74 cm. Placenta: Anterior. Cervical length: 4.5 cm.  The patient was informed that anomalies may be missed due to technical limitations. If the fetus is in a suboptimal position or maternal habitus is increased, visualization of the fetus in the maternal uterus may be impaired.  Carrier for cystic fibrosis  The patient has already met with our genetic counselor regarding her fetus' risk for having cystic fibrosis.    It is unlikely that the FOB will be screened to determine if he is also a carrier for cystic fibrosis.    She is comfortable with the fairly low risk that her baby will be affected with cystic fibrosis.    Due to maternal obesity, a follow-up growth scan was scheduled in 4 weeks.  The patient stated that all of her questions were answered.   A total of 30 minutes was spent counseling and coordinating the care for this patient.  Greater than 50% of the time was spent in direct face-to-face contact.

## 2024-11-15 NOTE — Progress Notes (Signed)
 PRENATAL VISIT NOTE  Subjective:  Vicki Daniel is a 28 y.o. G5P1031 at [redacted]w[redacted]d being seen today for ongoing prenatal care.  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  Patient reports insomnia, depression, anxiety, and stress. She reports trouble falling asleep, staying asleep, and nightmares like car crashes or preterm labor. It makes her nervous to go to sleep. She missed her scheduled appointment with Behavioral Health. Contractions: Not present. Vag. Bleeding: None.  Movement: Present. Denies leaking of fluid.   The following portions of the patient's history were reviewed and updated as appropriate: allergies, current medications, past family history, past medical history, past social history, past surgical history and problem list.   Objective:   Vitals:   11/15/24 0931  BP: 111/68  Pulse: 80  Weight: 236 lb 4.8 oz (107.2 kg)    Fetal Status:  Fetal Heart Rate (bpm): 156   Movement: Present    General: Alert, oriented and cooperative. Patient is in no acute distress.  Skin: Skin is warm and dry. No rash noted.   Cardiovascular: Normal heart rate noted  Respiratory: Normal respiratory effort, no problems with respiration noted  Abdomen: Soft, gravid, appropriate for gestational age.  Pain/Pressure: Present     Pelvic: Cervical exam deferred        Extremities: Normal range of motion.  Edema: Trace  Mental Status: Normal mood and affect. Normal behavior. Normal judgment and thought content.      11/15/2024    9:40 AM 09/18/2024    2:06 PM 08/13/2024    1:46 PM  Depression screen PHQ 2/9  Decreased  Interest 3 0 0  Down, Depressed, Hopeless 2 0 0  PHQ - 2 Score 5 0 0  Altered sleeping 3 1 0  Tired, decreased energy 3 1 0  Change in appetite 3 0 0  Feeling bad or failure about yourself  3 0 0  Trouble concentrating 2 0 0  Moving slowly or fidgety/restless 3 0 0  Suicidal thoughts 0 0 0  PHQ-9 Score 22 2  0      Data saved with a previous flowsheet row definition        11/15/2024    9:43 AM 09/18/2024    2:06 PM 08/13/2024    1:47 PM  GAD 7 : Generalized Anxiety Score  Nervous, Anxious, on Edge 2 0 0  Control/stop worrying 3 0 0  Worry too much - different things 3 0 0  Trouble relaxing 3 0 0  Restless 3 0 0  Easily annoyed or irritable 3 0 0  Afraid - awful might happen 3 0 0  Total GAD 7 Score 20 0 0    Assessment and Plan:  Pregnancy: G5P1031 at [redacted]w[redacted]d 1. Supervision of other normal pregnancy, antepartum (Primary) Prenatal course reviewed BP, HR, FHR within normal limits Feeling regular FM. Discussed kick counts, fetal sleep-wake cycles  2. Depression affecting pregnancy in third trimester, antepartum PHQ-9 score of 22. Discussed options for management including therapy and medication.  Offered starting sertraline  or similar to depression/anxiety in addition to therapy, patient is agreeable. States her cousin started Zoloft  during pregnancy and is still taking it. Prescription sent to pharmacy. Can increase dose if needed  in the future. Recommend to reschedule with Vicki Seats, LCSW at checkout.  3. Anxiety disorder affecting pregnancy, antepartum GAD-7 score of 20. Discussed options for management including therapy and medication.  Offered hydroxyzine  prn for sleep and anxiety, patient is agreeable to trial.  4. [redacted] weeks gestation of pregnancy Patient too late for GTT today, will reschedule appointment.   Preterm labor symptoms and general obstetric precautions including but not limited to vaginal bleeding, contractions, leaking of fluid and fetal  movement were reviewed in detail with the patient. Please refer to After Visit Summary for other counseling recommendations.   Return in about 2 weeks (around 11/29/2024) for LOB.  Future Appointments  Date Time Provider Department Center  11/15/2024  2:00 PM [facility] PROVIDER 1 [facility]  11/15/2024  2:30 PM [facility] US2 [facility]  11/22/2024  8:30 AM [facility] None    Joesph DELENA Sear, PA

## 2024-11-15 NOTE — Patient Instructions (Addendum)
 START sertraline  (Zoloft ) once a day. This will help with depression, anxiety, and sleep, but it can take 1-2 weeks to notice an effect. Taking it every single day consistently is the most important, you can take it any time of the day through. START hydroxyzine  at bedtime each day. This will help with sleep and anxiety.  What is a fetal movement count?  A fetal movement count  is the number of times that you feel your baby move during a certain amount of time. This may also be called a fetal kick count by some people, but fetal movement count is a more accurate term. Movements can be kicks, flutters, swishes, rolls, or jabs. A fetal movement count is recommended for every pregnant woman. You may be asked to start counting fetal movements as early as week 28 of your pregnancy. Pay attention to when your baby is most active. Your baby has times of activity and times to sleep just like you do! You may notice your baby's sleep and wake cycles. You may also notice things that make your baby move more. You should do a fetal movement count: When your baby is normally most active. At the same time each day. A good time to count movements is while you are resting, after having something to eat and drink.  How do I count fetal movements? Find a quiet, comfortable area. Sit, or lie down on your side. Write down the date, the start time and stop time, and the number of movements that you felt between those two times. Take this information with you to your health care visits. Write down your start time when you feel the first movement. Count kicks, flutters, swishes, rolls, and jabs. You should feel at least 10 movements. You may stop counting after you have felt 10 movements, or if you have been counting for 2 hours. Write down the stop time. If you do not feel 10 movements in 2 hours, contact your health care provider for further instructions. Your health care provider may want to do additional tests to  assess your baby's well-being. Contact a health care provider if: You feel fewer than 10 movements in 2 hours. Your baby is not moving like he or she usually does.

## 2024-11-15 NOTE — Progress Notes (Signed)
 Pt presents for ROB visit.  Pt requesting work from home accommodations due to depression, anxiety and stress. C/o insomnia. Missed appt with [facility] counselor.   Pt was seen at MAU 11-27 for decreased fetal movement  Declines Tdap and flu vaccines

## 2024-11-20 ENCOUNTER — Ambulatory Visit: Admitting: Licensed Clinical Social Worker

## 2024-11-20 DIAGNOSIS — F32A Depression, unspecified: Secondary | ICD-10-CM

## 2024-11-20 DIAGNOSIS — F419 Anxiety disorder, unspecified: Secondary | ICD-10-CM

## 2024-11-20 DIAGNOSIS — O99343 Other mental disorders complicating pregnancy, third trimester: Secondary | ICD-10-CM

## 2024-11-20 DIAGNOSIS — Z3A28 28 weeks gestation of pregnancy: Secondary | ICD-10-CM

## 2024-11-20 NOTE — BH Specialist Note (Unsigned)
 Integrated Behavioral Health via Telemedicine Visit  11/23/2024 Vicki Daniel [id number]  Number of Integrated Behavioral Health Clinician visits: 1- Initial Visit  Session Start time: 0915   Session End time: 1011  Total time in minutes: 56    Referring Provider:  Patient/Family location: Home [facility] Provider location: Remote Office All persons participating in visit: Patient and [facility] Types of Service: Individual psychotherapy and Video visit  I connected with Glade Anon and/or Glade Bratcher's patient via  Telephone or Video Enabled Telemedicine Application  (Video is Caregility application) and verified that I am speaking with the correct person using two identifiers. Discussed confidentiality: Yes   I discussed the limitations of telemedicine and the availability of in person appointments.  Discussed there is a possibility of technology failure and discussed alternative modes of communication if that failure occurs.  I discussed that engaging in this telemedicine visit, they consent to the provision of behavioral healthcare and the services will be billed under their insurance.  Patient and/or legal guardian expressed understanding and consented to Telemedicine visit: Yes   Presenting Concerns: Patient and/or family reports the following symptoms/concerns: increased stressors, depression and anxiety Duration of problem: Months; Severity of problem: moderate  Patient and/or Family's Strengths/Protective Factors: Social and Emotional competence, Concrete supports in place (healthy food, safe environments, etc.), Physical Health (exercise, healthy diet, medication compliance, etc.), and Caregiver has knowledge of parenting & child development  Goals Addressed: Patient will:  Reduce symptoms of: anxiety and depression   Increase knowledge and/or ability of: coping skills, healthy habits, and self-management skills   Demonstrate ability to: Increase healthy adjustment to  current life circumstances and Increase adequate support systems for patient/family  Progress towards Goals: Ongoing    Interventions: Interventions utilized:  Mindfulness or Management Consultant, Supportive Counseling, Psychoeducation and/or Health Education, Communication Skills, and Supportive Reflection Standardized Assessments completed: Not Needed    Patient and/or Family Response: The patient was present for a virtual session and reported ongoing stress, depressive symptoms, anxiety, sleep disturbance, and feeling overwhelmed by increased psychosocial stressors. She endorsed experiencing intense, vivid dreams that at times disrupt her sleep, as well as daily racing thoughts related to her future, pregnancy, son, financial obligations, and employment. 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 She stated that the purpose of the visit was to request work-from-home accommodations, noting that her manager has indicated she may retain her position if medical documentation is provided. The patient reported being prescribed sertraline  (Zoloft ) and hydroxyzine  within the past week, which she obtained the previous evening but has not yet initiated. She was provided with education regarding behavioral health services, the clinical assessment process, and the recommendation to initiate prescribed medications to assist with symptom reduction. At this time, the behavioral health clinician was unable to provide documentation for workplace accommodations.   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 She reports that these symptoms are impacting her daily functioning and ability to work..   Patient may benefit from integrated behavioral health services.  Plan: Follow up with behavioral health clinician on : Patient will schedule a follow up appt.  Behavioral recommendations: Patient to initiate prescribed medications as directed and engaging in ongoing behavioral health services  for further assessment and symptom management. Continued monitoring of mood, anxiety, sleep patterns, and stressors was also recommended. Referral(s): Integrated [employer] (In Clinic)  I discussed the assessment and treatment plan with the patient and/or parent/guardian. They were provided an opportunity to ask questions and all were answered. They agreed with the plan and demonstrated an understanding of the instructions.   They were advised to call back or seek an in-person evaluation if the symptoms worsen or if the condition fails to improve as anticipated.  Dewel Lotter LITTIE Seats, LCSWA

## 2024-11-22 ENCOUNTER — Other Ambulatory Visit

## 2024-11-26 ENCOUNTER — Other Ambulatory Visit: Payer: Self-pay | Admitting: Family Medicine

## 2024-11-26 DIAGNOSIS — F419 Anxiety disorder, unspecified: Secondary | ICD-10-CM

## 2024-12-02 ENCOUNTER — Encounter: Admitting: Obstetrics and Gynecology

## 2024-12-18 ENCOUNTER — Encounter: Payer: Self-pay | Admitting: Obstetrics and Gynecology

## 2024-12-18 ENCOUNTER — Other Ambulatory Visit: Payer: Self-pay

## 2024-12-19 ENCOUNTER — Other Ambulatory Visit

## 2024-12-19 ENCOUNTER — Ambulatory Visit

## 2024-12-25 ENCOUNTER — Encounter: Admitting: Physician Assistant

## 2024-12-27 ENCOUNTER — Ambulatory Visit (HOSPITAL_BASED_OUTPATIENT_CLINIC_OR_DEPARTMENT_OTHER)

## 2024-12-27 ENCOUNTER — Ambulatory Visit: Attending: Obstetrics | Admitting: Obstetrics

## 2024-12-27 VITALS — BP 123/65 | HR 91

## 2024-12-27 DIAGNOSIS — O99213 Obesity complicating pregnancy, third trimester: Secondary | ICD-10-CM | POA: Insufficient documentation

## 2024-12-27 DIAGNOSIS — E669 Obesity, unspecified: Secondary | ICD-10-CM

## 2024-12-27 DIAGNOSIS — Z362 Encounter for other antenatal screening follow-up: Secondary | ICD-10-CM

## 2024-12-27 DIAGNOSIS — O99210 Obesity complicating pregnancy, unspecified trimester: Secondary | ICD-10-CM

## 2024-12-27 DIAGNOSIS — Z3A34 34 weeks gestation of pregnancy: Secondary | ICD-10-CM | POA: Insufficient documentation

## 2024-12-27 DIAGNOSIS — O320XX0 Maternal care for unstable lie, not applicable or unspecified: Secondary | ICD-10-CM | POA: Diagnosis not present

## 2024-12-27 DIAGNOSIS — Z141 Cystic fibrosis carrier: Secondary | ICD-10-CM | POA: Diagnosis not present

## 2024-12-27 DIAGNOSIS — F1910 Other psychoactive substance abuse, uncomplicated: Secondary | ICD-10-CM

## 2024-12-27 DIAGNOSIS — O09893 Supervision of other high risk pregnancies, third trimester: Secondary | ICD-10-CM | POA: Diagnosis not present

## 2024-12-27 NOTE — Progress Notes (Signed)
 MFM Consult Note  Vicki Daniel is currently at [redacted]w[redacted]d. She has been followed due to maternal obesity with a BMI of 33.4.    She denies any problems since her last exam.    She has not been screened for gestational diabetes in her current pregnancy.  She reports feeling fetal movements throughout the day.    Her blood pressure today was 123/65.  Sonographic findings Single intrauterine pregnancy at 34w 1d.  Fetal cardiac activity:  Observed and appears normal. Presentation: Transverse, head to maternal left. Fetal biometry shows the estimated fetal weight of 5 lb 9 oz,  2528g (65%). Amniotic fluid volume: Within normal limits. AFI: 13.77cm.  MVP: 3.87 cm. Placenta: Anterior.  As the fetus remains in the breech/transverse presentation today, a follow-up exam was scheduled in 3 weeks.    Should the fetus remain in the breech/transverse presentation at her next exam, arrangements can be made for an external cephalic version.  The patient stated that all of her questions were answered.   A total of 20 minutes was spent counseling and coordinating the care for this patient.  Greater than 50% of the time was spent in direct face-to-face contact.

## 2025-01-10 ENCOUNTER — Ambulatory Visit: Payer: Self-pay | Admitting: Physician Assistant

## 2025-01-10 ENCOUNTER — Other Ambulatory Visit (HOSPITAL_COMMUNITY)
Admission: RE | Admit: 2025-01-10 | Discharge: 2025-01-10 | Disposition: A | Discharge status: Home / Self Care | Source: Ambulatory Visit | Attending: Obstetrics & Gynecology | Admitting: Obstetrics & Gynecology

## 2025-01-10 VITALS — BP 128/74 | HR 91 | Wt 238.0 lb

## 2025-01-10 DIAGNOSIS — Z6791 Unspecified blood type, Rh negative: Secondary | ICD-10-CM | POA: Diagnosis not present

## 2025-01-10 DIAGNOSIS — O26893 Other specified pregnancy related conditions, third trimester: Secondary | ICD-10-CM | POA: Diagnosis not present

## 2025-01-10 DIAGNOSIS — Z141 Cystic fibrosis carrier: Secondary | ICD-10-CM

## 2025-01-10 DIAGNOSIS — Z3480 Encounter for supervision of other normal pregnancy, unspecified trimester: Secondary | ICD-10-CM | POA: Diagnosis present

## 2025-01-10 DIAGNOSIS — Z3A36 36 weeks gestation of pregnancy: Secondary | ICD-10-CM

## 2025-01-10 DIAGNOSIS — F419 Anxiety disorder, unspecified: Secondary | ICD-10-CM

## 2025-01-10 DIAGNOSIS — Z6833 Body mass index (BMI) 33.0-33.9, adult: Secondary | ICD-10-CM

## 2025-01-10 DIAGNOSIS — O99343 Other mental disorders complicating pregnancy, third trimester: Secondary | ICD-10-CM

## 2025-01-10 LAB — CERVICOVAGINAL ANCILLARY ONLY
Chlamydia: NEGATIVE
Comment: NEGATIVE
Comment: NEGATIVE
Comment: NORMAL
Neisseria Gonorrhea: NEGATIVE
Trichomonas: NEGATIVE

## 2025-01-10 NOTE — Patient Instructions (Signed)
 Please schedule glucose tolerance test.

## 2025-01-12 ENCOUNTER — Ambulatory Visit: Payer: Self-pay | Admitting: Physician Assistant

## 2025-01-12 ENCOUNTER — Other Ambulatory Visit: Payer: Self-pay | Admitting: Physician Assistant

## 2025-01-17 ENCOUNTER — Encounter: Payer: Self-pay | Admitting: Obstetrics and Gynecology

## 2025-01-17 ENCOUNTER — Inpatient Hospital Stay: Admission: RE | Admit: 2025-01-17 | Discharge: 2025-01-17 | Payer: Self-pay

## 2025-01-17 ENCOUNTER — Other Ambulatory Visit: Payer: Self-pay

## 2025-01-17 VITALS — BP 104/70 | HR 95 | Temp 98.7°F | Resp 20 | Wt 238.0 lb

## 2025-01-17 DIAGNOSIS — N760 Acute vaginitis: Secondary | ICD-10-CM

## 2025-01-17 LAB — POCT URINE DIPSTICK
Bilirubin, UA: NEGATIVE
Blood, UA: NEGATIVE
Glucose, UA: 100 mg/dL — AB
Ketones, POC UA: NEGATIVE mg/dL
Leukocytes, UA: NEGATIVE
Nitrite, UA: NEGATIVE
POC PROTEIN,UA: NEGATIVE
Spec Grav, UA: 1.015
Urobilinogen, UA: 0.2 U/dL
pH, UA: 7

## 2025-01-17 MED ORDER — METRONIDAZOLE 500 MG PO TABS: 500.0000 mg | ORAL_TABLET | Freq: Two times a day (BID) | ORAL | 0 refills | Status: AC

## 2025-01-17 NOTE — Discharge Instructions (Signed)
 Metronidazole  sent for treatment of BV Swab sent for BV and yeast testing.

## 2025-01-17 NOTE — ED Provider Notes (Signed)
 " [facility]    CSN: [id number] Arrival date & time: 01/17/25  1455      History   Chief Complaint Chief Complaint  Patient presents with   Urinary Frequency    Test for uti and bv - Entered by patient   Abdominal Cramping   Vaginal Discharge    HPI Vicki Daniel is a 29 y.o. female.   Pt, that is presents today due to several days of malodorous vaginal discharge and lower abdominal pain. Pt denies concern for STIs.   The history is provided by the patient.  Urinary Frequency  Abdominal Cramping  Vaginal Discharge   Past Medical History:  Diagnosis Date   [redacted] weeks gestation of pregnancy 07/11/2022   Acne vulgaris 03/02/2015   Acute lower UTI 11/25/2017   Asthma    childhood   Bacterial vaginosis 11/25/2017   Cannabinoid hyperemesis syndrome    Headache    Hematemesis 11/25/2017   Hypokalemia 11/25/2017   Intractable nausea and vomiting 11/25/2017   Trichomonas vaginitis 11/25/2017    Patient Active Problem List   Diagnosis Date Noted   Depression affecting pregnancy in third trimester, antepartum 11/15/2024   Anxiety disorder affecting pregnancy, antepartum 11/15/2024   Rh negative state in antepartum period, second trimester 10/23/2024   Obesity in pregnancy 09/11/2024   Supervision of other normal pregnancy, antepartum 08/13/2024   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 Authorizing Provider  metroNIDAZOLE  (FLAGYL ) 500 MG tablet Take 1 tablet (500 mg total) by mouth 2 (two) times daily. 01/17/25  Yes Andra Corean BROCKS, PA-C  Prenatal Vit-Fe Fumarate-FA (PRENATAL VITAMIN PO) Take 1 tablet by mouth daily.   Yes [provider]  promethazine  (PHENERGAN ) 25 MG suppository Place 1 suppository (25 mg total) rectally every 6 (six) hours as needed for nausea or vomiting. Patient not taking: Reported on 07/18/2020 11/23/17 07/18/20  Emil Share, DO    Family History Family History  Problem Relation Age of Onset   Hypertension Father    Diabetes Maternal Grandmother    Cancer Maternal Grandmother    Stroke Maternal Grandfather    Heart disease Maternal Grandfather    Cancer Maternal Grandfather    Asthma Neg Hx     Social History Social History[1]   Allergies   Patient has no known allergies.   Review of Systems Review of Systems  Genitourinary:  Positive for frequency and vaginal discharge.     Physical Exam Triage Vital Signs ED Triage Vitals  Encounter Vitals Group     BP 01/17/25 1510 104/70     Girls Systolic BP Percentile --      Girls Diastolic BP Percentile --      Boys Systolic BP Percentile --      Boys Diastolic BP Percentile --  Pulse Rate 01/17/25 1510 91     Resp 01/17/25 1510 18     Temp 01/17/25 1510 98.7 F (37.1 C)     Temp Source 01/17/25 1510 Oral     SpO2 01/17/25 1510 96 %     Weight 01/17/25 1524 238 lb (108 kg)     Height --      Head Circumference --      Peak Flow --      Pain Score 01/17/25 1523 0     Pain Loc --      Pain Education --      Exclude from Growth Chart --    No data found.  Updated Vital Signs BP 104/70 (BP Location: Left Arm)   Pulse 95   Temp 98.7 F (37.1 C) (Oral)   Resp 20   Wt 238 lb (108 kg)   LMP 05/02/2024 (Exact Date)   SpO2 97%   BMI 35.15 kg/m   Visual Acuity Right Eye Distance:   Left Eye Distance:   Bilateral Distance:    Right Eye Near:   Left Eye Near:    Bilateral Near:     Physical Exam Vitals and nursing note reviewed.  Constitutional:      General: She is not in acute distress.     Appearance: Normal appearance. She is not ill-appearing, toxic-appearing or diaphoretic.  Eyes:     General: No scleral icterus. Cardiovascular:     Rate and Rhythm: Normal rate and regular rhythm.     Heart sounds: Normal heart sounds.  Pulmonary:     Effort: Pulmonary effort is normal. No respiratory distress.     Breath sounds: Normal breath sounds. No wheezing or rhonchi.  Abdominal:     General: Abdomen is flat. Bowel sounds are normal.     Palpations: Abdomen is soft.     Tenderness: There is abdominal tenderness in the suprapubic area. There is no right CVA tenderness or left CVA tenderness.  Skin:    General: Skin is warm.  Neurological:     Mental Status: She is alert and oriented to person, place, and time.  Psychiatric:        Mood and Affect: Mood normal.        Behavior: Behavior normal.      UC Treatments / Results  Labs (all labs ordered are listed, but only abnormal results are displayed) Labs Reviewed  POCT URINE DIPSTICK - Abnormal; Notable for the following components:      Result Value   Clarity, UA hazy (*)    Glucose, UA =100 (*)    All other components within normal limits  CERVICOVAGINAL ANCILLARY ONLY    EKG   Radiology No results found.  Procedures Procedures (including critical care time)  Medications Ordered in UC Medications - No data to display  Initial Impression / Assessment and Plan / UC Course  I have reviewed the triage vital signs and the nursing notes.  Pertinent labs & imaging results that were available during my care of the patient were reviewed by me and considered in my medical decision making (see chart for details).     Final Clinical Impressions(s) / UC Diagnoses   Final diagnoses:  Acute vaginitis     Discharge Instructions      Metronidazole  sent for treatment of BV Swab sent for BV and yeast testing.     ED Prescriptions     Medication Sig Dispense Auth. Provider   metroNIDAZOLE  (FLAGYL ) 500 MG  tablet Take 1 tablet (500 mg total) by mouth 2 (two) times daily. 14 tablet Andra Corean BROCKS, PA-C      PDMP not reviewed this encounter.    [1]  Social History Tobacco Use   Smoking status: Never    Passive exposure: Never   Smokeless tobacco: Never  Vaping Use   Vaping status: Former   Substances: Nicotine, Flavoring  Substance Use Topics   Alcohol use: Not Currently   Drug use: Not Currently    Types: Marijuana    Comment: stopped a week before found out pregnant     Andra Corean BROCKS, PA-C 01/17/25 1706  "

## 2025-01-17 NOTE — ED Triage Notes (Signed)
 Pt presents c/o vaginal discharge, abd cramping, and vaginal odor x several weeks. Pt states,  I have been having cramping and an odor. I want to be tested for yeats infection and BV.  Pt denies any additional sxs.

## 2025-01-20 ENCOUNTER — Ambulatory Visit

## 2025-01-20 ENCOUNTER — Other Ambulatory Visit

## 2025-02-06 ENCOUNTER — Inpatient Hospital Stay (HOSPITAL_COMMUNITY): Admit: 2025-02-06
